# Patient Record
Sex: Male | Born: 1945 | Race: Black or African American | Hispanic: No | Marital: Single | Smoking: Former smoker
Health system: Southern US, Community
[De-identification: ages and names within clinical notes are randomized; demographics above are authoritative.]

## PROBLEM LIST (undated history)

## (undated) DIAGNOSIS — F419 Anxiety disorder, unspecified: Secondary | ICD-10-CM

## (undated) DIAGNOSIS — E785 Hyperlipidemia, unspecified: Secondary | ICD-10-CM

## (undated) DIAGNOSIS — I10 Essential (primary) hypertension: Secondary | ICD-10-CM

## (undated) DIAGNOSIS — E119 Type 2 diabetes mellitus without complications: Secondary | ICD-10-CM

## (undated) DIAGNOSIS — F32A Depression, unspecified: Secondary | ICD-10-CM

## (undated) DIAGNOSIS — M1990 Unspecified osteoarthritis, unspecified site: Secondary | ICD-10-CM

---

## 1997-07-12 ENCOUNTER — Ambulatory Visit (HOSPITAL_COMMUNITY): Admission: RE | Admit: 1997-07-12 | Discharge: 1997-07-12 | Payer: Self-pay | Admitting: Internal Medicine

## 1997-12-23 ENCOUNTER — Emergency Department (HOSPITAL_COMMUNITY): Admission: EM | Admit: 1997-12-23 | Discharge: 1997-12-23 | Payer: Self-pay | Admitting: Emergency Medicine

## 1998-07-18 ENCOUNTER — Ambulatory Visit: Admission: RE | Admit: 1998-07-18 | Discharge: 1998-07-18 | Payer: Self-pay | Admitting: Internal Medicine

## 1998-11-01 ENCOUNTER — Emergency Department (HOSPITAL_COMMUNITY): Admission: EM | Admit: 1998-11-01 | Discharge: 1998-11-01 | Payer: Self-pay

## 1999-05-22 ENCOUNTER — Encounter: Payer: Self-pay | Admitting: Urology

## 1999-05-22 ENCOUNTER — Encounter: Admission: RE | Admit: 1999-05-22 | Discharge: 1999-05-22 | Payer: Self-pay | Admitting: Urology

## 2003-03-25 ENCOUNTER — Ambulatory Visit (HOSPITAL_BASED_OUTPATIENT_CLINIC_OR_DEPARTMENT_OTHER): Admission: RE | Admit: 2003-03-25 | Discharge: 2003-03-25 | Payer: Self-pay | Admitting: Family Medicine

## 2007-02-22 ENCOUNTER — Emergency Department: Payer: Self-pay | Admitting: Unknown Physician Specialty

## 2011-07-12 ENCOUNTER — Encounter (HOSPITAL_COMMUNITY): Payer: Self-pay | Admitting: Emergency Medicine

## 2011-07-12 ENCOUNTER — Emergency Department (HOSPITAL_COMMUNITY)
Admission: EM | Admit: 2011-07-12 | Discharge: 2011-07-12 | Disposition: A | Discharge status: Home / Self Care | Payer: Non-veteran care | Attending: Emergency Medicine | Admitting: Emergency Medicine

## 2011-07-12 DIAGNOSIS — R252 Cramp and spasm: Secondary | ICD-10-CM | POA: Insufficient documentation

## 2011-07-12 DIAGNOSIS — N289 Disorder of kidney and ureter, unspecified: Secondary | ICD-10-CM | POA: Insufficient documentation

## 2011-07-12 DIAGNOSIS — E785 Hyperlipidemia, unspecified: Secondary | ICD-10-CM | POA: Insufficient documentation

## 2011-07-12 DIAGNOSIS — M79609 Pain in unspecified limb: Secondary | ICD-10-CM | POA: Insufficient documentation

## 2011-07-12 DIAGNOSIS — I10 Essential (primary) hypertension: Secondary | ICD-10-CM | POA: Insufficient documentation

## 2011-07-12 HISTORY — DX: Hyperlipidemia, unspecified: E78.5

## 2011-07-12 HISTORY — DX: Essential (primary) hypertension: I10

## 2011-07-12 LAB — DIFFERENTIAL
Lymphs Abs: 2.2 10*3/uL (ref 0.7–4.0)
Monocytes Relative: 9 % (ref 3–12)
Neutro Abs: 2.4 10*3/uL (ref 1.7–7.7)
Neutrophils Relative %: 47 % (ref 43–77)

## 2011-07-12 LAB — CBC
Hemoglobin: 15.7 g/dL (ref 13.0–17.0)
MCH: 31.4 pg (ref 26.0–34.0)
RBC: 5 MIL/uL (ref 4.22–5.81)

## 2011-07-12 LAB — BASIC METABOLIC PANEL
GFR calc Af Amer: 56 mL/min — ABNORMAL LOW (ref >90)
GFR calc non Af Amer: 49 mL/min — ABNORMAL LOW (ref >90)
Glucose, Bld: 117 mg/dL — ABNORMAL HIGH (ref 70–99)
Potassium: 3.9 mEq/L (ref 3.5–5.1)
Sodium: 135 mEq/L (ref 135–145)

## 2011-07-12 MED ORDER — IBUPROFEN 800 MG PO TABS: 800.0000 mg | ORAL_TABLET | Freq: Once | ORAL | Status: DC

## 2011-07-12 MED ORDER — NAPROXEN 375 MG PO TABS: 375.0000 mg | ORAL_TABLET | Freq: Two times a day (BID) | ORAL | Status: DC

## 2011-07-12 MED ORDER — DIAZEPAM 5 MG PO TABS
5.0000 mg | ORAL_TABLET | Freq: Once | ORAL | Status: AC
  Administered 2011-07-12: 5 mg via ORAL
  Filled 2011-07-12: qty 1

## 2011-07-12 MED ORDER — DIAZEPAM 5 MG PO TABS: 5.0000 mg | ORAL_TABLET | Freq: Once | ORAL | Status: AC

## 2011-07-12 MED ORDER — DIAZEPAM 5 MG PO TABS: ORAL_TABLET | ORAL | Status: AC

## 2011-07-12 NOTE — ED Notes (Signed)
Pt c/o of cramps in hands and feet that are intermittent x 1 week. Pt has take magnesium and seltzer water with no relief.

## 2011-07-12 NOTE — Discharge Instructions (Signed)
Take valium for muscle relaxation. Do not drive or operate machinery with valium. Follow up with your primary care provider in one week to recheck your kidney function. Stay well hydrated with 8-10 glasses of water a day. Follow up with hand orthopedic specialist in the next 1-2 weeks for recheck of ongoing symptoms but return to ER for emergent changing or worsening of symptoms.

## 2011-07-12 NOTE — ED Provider Notes (Signed)
History     CSN: [id number]  Arrival date & time 07/12/11  1224   First MD Initiated Contact with Patient 07/12/11 1340      Chief Complaint  Patient presents with  . Pain    cramping pain in hands and feet    (Consider location/radiation/quality/duration/timing/severity/associated sxs/prior treatment) HPI  Patient presents to ER complaining of a one week hx of intermittent left forearm/hand cramping and left foot cramping. Patient states pain with sporadically occur with spasming and tightening of muscles but usually if he moved hand or grips something tightly. States pain and spasm will last a minute or two and resoves if he relaxes hand and massages arm. Patient states he was started on a new blood pressure medicine a couple of weeks ago but unsure of name. Patient's PCP is through the [place]. Denies joint or extremity swelling, heat or erythema.   Patient states he has been taking OTC magnesium and seltzer water without relief of symptoms.   Past Medical History  Diagnosis Date  . Hypertension   . Hyperlipemia     History reviewed. No pertinent past surgical history.  No family history on file.  History  Substance Use Topics  . Smoking status: Former Games developer  . Smokeless tobacco: Not on file  . Alcohol Use: No      Review of Systems  All other systems reviewed and are negative.    Allergies  Review of patient's allergies indicates no known allergies.  Home Medications   Current Outpatient Rx  Name Route Sig Dispense Refill  . ACETAMINOPHEN-CODEINE #3 300-30 MG PO TABS Oral Take 1 tablet by mouth every 4 (four) hours as needed. For pain    . ASPIRIN EC 81 MG PO TBEC Oral Take 81 mg by mouth daily.    . CYCLOBENZAPRINE HCL 10 MG PO TABS Oral Take 10 mg by mouth 3 (three) times daily as needed.    . IBUPROFEN 800 MG PO TABS Oral Take 800 mg by mouth every 8 (eight) hours as needed.    . MOMETASONE FUROATE 50 MCG/ACT NA SUSP Nasal Place 2 sprays into the nose  daily.    . ADULT MULTIVITAMIN W/MINERALS CH Oral Take 1 tablet by mouth daily.    Marland Kitchen OVER THE COUNTER MEDICATION Oral Take 1 tablet by mouth 2 (two) times daily. For allergies      BP 131/89  Pulse 96  Temp 98.7 F (37.1 C)  Resp 18  SpO2 99%  Physical Exam  Nursing note and vitals reviewed. Constitutional: He is oriented to person, place, and time. He appears well-developed and well-nourished. No distress.  HENT:  Head: Normocephalic and atraumatic.  Eyes: Conjunctivae are normal.  Neck: Normal range of motion. Neck supple.  Cardiovascular: Normal rate, regular rhythm, normal heart sounds and intact distal pulses.  Exam reveals no gallop and no friction rub.   No murmur heard. Pulmonary/Chest: Effort normal and breath sounds normal. No respiratory distress. He has no wheezes. He has no rales. He exhibits no tenderness.  Musculoskeletal: Normal range of motion. He exhibits tenderness. He exhibits no edema.       Muscle spasticity of left forearm with palpation of forearm and movement of left wrist but no extremity swelling, erythema or skin changes. No heat.   Neurological: He is alert and oriented to person, place, and time.  Skin: Skin is warm and dry. No rash noted. He is not diaphoretic. No erythema.  Psychiatric: He has a normal mood and affect.  ED Course  Procedures (including critical care time)  PO ibuprofen   Labs Reviewed  BASIC METABOLIC PANEL - Abnormal; Notable for the following:    Glucose, Bld 117 (*)    BUN 29 (*)    Creatinine, Ser 1.46 (*)    GFR calc non Af Amer 49 (*)    GFR calc Af Amer 56 (*)    All other components within normal limits  CBC  DIFFERENTIAL   No results found.   1. Muscle cramps       MDM  No acute findings on labs but mild renal insufficiency that I spoke at length with patient about the need for close follow up with PCP for recheck as well as recheck of ongoing intermittent cramping. Patient voices understanding and is  agreeable to plan.   No signs of extremity infection with extremities neuro vasc intact. FROM of bilateral UE with normal reflexes and sensation.         [place], [place] 07/12/11 1529

## 2011-07-12 NOTE — ED Notes (Signed)
Pt reports painful twisting cramps in both hands and feet.

## 2011-07-14 NOTE — ED Provider Notes (Signed)
Medical screening examination/treatment/procedure(s) were performed by non-physician practitioner and as supervising physician I was immediately available for consultation/collaboration.   Marcin Holte L Jalaysia Lobb, MD 07/14/11 0736 

## 2016-01-24 ENCOUNTER — Encounter: Payer: Self-pay | Admitting: Podiatry

## 2016-01-24 ENCOUNTER — Ambulatory Visit (HOSPITAL_BASED_OUTPATIENT_CLINIC_OR_DEPARTMENT_OTHER)
Admission: RE | Admit: 2016-01-24 | Discharge: 2016-01-24 | Disposition: A | Discharge status: Home / Self Care | Payer: Non-veteran care | Source: Ambulatory Visit | Attending: Podiatry | Admitting: Podiatry

## 2016-01-24 ENCOUNTER — Ambulatory Visit (INDEPENDENT_AMBULATORY_CARE_PROVIDER_SITE_OTHER): Payer: Non-veteran care | Admitting: Podiatry

## 2016-01-24 DIAGNOSIS — R52 Pain, unspecified: Secondary | ICD-10-CM

## 2016-01-24 DIAGNOSIS — M6740 Ganglion, unspecified site: Secondary | ICD-10-CM

## 2016-01-24 NOTE — Progress Notes (Signed)
   Subjective:    Patient ID: Dale Le, male    DOB: 1945/02/28, 70 y.o.   MRN: [id number]  HPI  70 year old male presents the also concerns of painful lesion to the bottom of his left foot along the big toe. This been ongoing for the last several weeks the areas painful with pressure in shoe gear. He denies stepping on any foreign objects denies any recent injury or trauma. He states that the mass has not changed in size. He denies any skin change.he's had no recent treatment. No numbness or tingling. No other complaints at this time.   Review of Systems  All other systems reviewed and are negative.      Objective:   Physical Exam General: AAO x3, NAD  Dermatological: Skin is warm, dry and supple bilateral. Nails x 10 are well manicured; remaining integument appears unremarkable at this time. There are no open sores, no preulcerative lesions, no rash or signs of infection present.  Vascular: Dorsalis Pedis artery and Posterior Tibial artery pedal pulses are 2/4 bilateral with immedate capillary fill time. Pedal hair growth present. There is no pain with calf compression, swelling, warmth, erythema.   Neruologic: Grossly intact via light touch bilateral. Vibratory intact via tuning fork bilateral. Protective threshold with Semmes Wienstein monofilament intact to all pedal sites bilateral.   Musculoskeletal: on the plantar medial aspect of the left first MPJ is a small approximately 0.5 x 0.5 cm mobile fluid-filled soft tissue mass. There is mild tenderness to palpation. On the lesion. There is no other areas of tenderness there is no pain with MPJ range of motion. There is no other masses identified.Muscular strength 5/5 in all groups tested bilateral.  Gait: Unassisted, Nonantalgic.     Assessment & Plan:  70 year old male left foot cyst, likely ganglion on. -Treatment options discussed including all alternatives, risks, and complications -Etiology of symptoms were  discussed -X-rays were obtained and reviewed with the patient. Cannot appreciate any evidence of acute fracture or bony destruction. -I discussed with him a steroid injection into the mass and he understands this understanding risks and complications. Under sterile conditions a mixture of dexamethasone and local anesthetic was infiltrated without any complications. Post injection care was discussed. Offloading pads were dispensed. -Follow-up in 4 weeks if symptoms continue or sooner if needed. Discussed that if symptoms continue possible MRI  Ovid CurdMatthew Wagoner, DPM

## 2016-02-28 ENCOUNTER — Encounter: Payer: Self-pay | Admitting: Podiatry

## 2016-02-28 ENCOUNTER — Ambulatory Visit (INDEPENDENT_AMBULATORY_CARE_PROVIDER_SITE_OTHER): Payer: No Typology Code available for payment source | Admitting: Podiatry

## 2016-02-28 DIAGNOSIS — E119 Type 2 diabetes mellitus without complications: Secondary | ICD-10-CM | POA: Diagnosis not present

## 2016-02-28 DIAGNOSIS — M6740 Ganglion, unspecified site: Secondary | ICD-10-CM

## 2016-03-01 NOTE — Progress Notes (Signed)
Subjective: 71 year old male presents the office they for follow-up evaluation of lesion to the left foot and on the bottom of the big toe. He states that after the injection the area cut substantially smaller been started to come back. He said the area is painful the walks on it quite a bit. Denies any swelling or redness. Denies any systemic complaints such as fevers, chills, nausea, vomiting. No acute changes since last appointment, and no other complaints at this time.   Objective: AAO x3, NAD DP/PT pulses palpable bilaterally, CRT less than 3 seconds On the plantar aspect of the left hallux is a small mobile fluid filled soft tissue mass. There is mild tenderness to palpation. Measures about the same size as last time of 0.5 x 0.5 cm. There is no overlying erythema or skin irritation. Mild hammertoes are present. No open lesions or pre-ulcerative lesions.  No pain with calf compression, swelling, warmth, erythema  Assessment: Left foot soft tissue mass  Plan: -All treatment options discussed with the patient including all alternatives, risks, complications.  -Discussed MRI however he declined. Discussed a repeat steroid injection to the area and he wishes to proceed with this. Under sterile conditions a mixture of dexamethasone and local anesthetic was infiltrated into the mass without complications. Post injection care was discussed. Discussed that if the symptoms continue MRI and/or possible surgical excision. -He is requesting a prescription for diabetic shoes for the [facility]. This was provided today. -Follow up as scheduled or sooner if needed. -Patient encouraged to call the office with any questions, concerns, change in symptoms.   Ovid CurdMatthew Wagoner, DPM

## 2016-03-21 ENCOUNTER — Encounter: Payer: Self-pay | Admitting: Podiatry

## 2016-03-21 ENCOUNTER — Telehealth: Payer: Self-pay | Admitting: Podiatry

## 2016-03-21 NOTE — Telephone Encounter (Signed)
I believe this is for diabetic shoes? Can you please send this or make sure this is what she is talking about? Thanks.

## 2016-04-10 ENCOUNTER — Ambulatory Visit (INDEPENDENT_AMBULATORY_CARE_PROVIDER_SITE_OTHER): Payer: Non-veteran care | Admitting: Podiatry

## 2016-04-10 ENCOUNTER — Encounter: Payer: Self-pay | Admitting: Podiatry

## 2016-04-10 DIAGNOSIS — M6740 Ganglion, unspecified site: Secondary | ICD-10-CM

## 2016-04-10 MED ORDER — DEXAMETHASONE SODIUM PHOSPHATE 120 MG/30ML IJ SOLN
2.0000 mg | Freq: Once | INTRAMUSCULAR | Status: AC
  Administered 2016-04-10: 2 mg via INTRA_ARTICULAR

## 2016-04-10 NOTE — Progress Notes (Signed)
Subjective: 71 year old male presents the office they for follow-up evaluation of  Soft tissue mass to the left foot and on the bottom of the big toe. He states the areas to do better has gotten much smaller incisal he still notices it with walking and describes as a throbbing sensation at times. He still also awaiting diabetic shoes. Denies any increase in swelling, redness to his feet he has no new complaints today. Denies any systemic complaints such as fevers, chills, nausea, vomiting. No acute changes since last appointment, and no other complaints at this time.   Objective: AAO x3, NAD DP/PT pulses palpable bilaterally, CRT less than 3 seconds On the plantar aspect of the left hallux is a small mobile fluid filled soft tissue mass. The mass appears to be smaller in size as well as somewhat softer in nature. There is minimal tenderness to palpation to this area. There is no overlying skin changes no erythema. Mild hammertoes are present. No open lesions or pre-ulcerative lesions.  No pain with calf compression, swelling, warmth, erythema  Assessment: Left foot soft tissue mass, improvement   Plan: -All treatment options discussed with the patient including all alternatives, risks, complications.  -He would like to off on any surgical excision her MRI. He states that he like to proceed with another steroid injection to help further off the mass resolve as this is been helping. Understanding risks and complications he wished to proceed. Under sterile conditions after fixing the visit local anesthetic was infiltrated directly into the soft tissue mass without complications. Post injection care was discussed. -Follow up in 2 months or sooner if needed. -Patient encouraged to call the office with any questions, concerns, change in symptoms.   Ovid CurdMatthew Avi Archuleta, DPM

## 2016-04-24 NOTE — Telephone Encounter (Signed)
I have already sent the paperwork over and just waiting on the doctor to sign. Misty StanleyLisa

## 2016-06-12 ENCOUNTER — Encounter: Payer: Self-pay | Admitting: Podiatry

## 2016-06-12 ENCOUNTER — Ambulatory Visit (INDEPENDENT_AMBULATORY_CARE_PROVIDER_SITE_OTHER): Payer: No Typology Code available for payment source | Admitting: Podiatry

## 2016-06-12 DIAGNOSIS — M6740 Ganglion, unspecified site: Secondary | ICD-10-CM | POA: Diagnosis not present

## 2016-06-12 DIAGNOSIS — M2042 Other hammer toe(s) (acquired), left foot: Secondary | ICD-10-CM

## 2016-06-12 DIAGNOSIS — E1149 Type 2 diabetes mellitus with other diabetic neurological complication: Secondary | ICD-10-CM | POA: Diagnosis not present

## 2016-06-12 DIAGNOSIS — M2041 Other hammer toe(s) (acquired), right foot: Secondary | ICD-10-CM

## 2016-06-15 NOTE — Progress Notes (Signed)
Subjective: 71 year old male presents the office they for follow-up evaluation of soft tissue mass to the left foot and on the bottom of the big toe. She states that the area is much smaller and is doing well but he still had notices a little bit at times when goes the gym. Overall he states he is happy the progress so far. He is inquiring another injection into the cyst to further help shrink it. He also states he is starting some numbness of the tips of his right 4 toes on toes 2 through 5 when he walks. Denies any recent injury or trauma. Is also inquiring about diabetic shoes again today. Denies any systemic complaints such as fevers, chills, nausea, vomiting. No acute changes since last appointment, and no other complaints at this time.   Objective: AAO x3, NAD DP/PT pulses palpable bilaterally, CRT less than 3 seconds On the plantar aspect of the left hallux is a small mobile fluid filled soft tissue mass. Again the area appears to be improving numbness the size of a pea. There is no overlying erythema or increase in warmth. Cement reducible hammertoes are present and there is tenderness subjectively to the distal tip of the toe when he is walking. No open lesions or pre-ulcerative lesions.  No pain with calf compression, swelling, warmth, erythema  Assessment: Left foot soft tissue mass, improvement; Hammertoes  Plan: -All treatment options discussed with the patient including all alternatives, risks, complications.  -He would like to proceed with another steroid injection to the mass as this is been helping. Understanding risks and complications he wished to proceed. Under sterile conditions after fixing the visit local anesthetic was infiltrated directly into the soft tissue mass without complications. Post injection care was discussed. -Dispensed toe crests for hammertoes to see if this will help. Also discussed likely neuropathy -He was measured for diabetic shoes today to wear in a hole  limits to recheck with his insurance. He is also been contacted his insurance. -Follow up in 2 months or sooner if needed. -Patient encouraged to call the office with any questions, concerns, change in symptoms.   [name], DPM

## 2016-06-19 ENCOUNTER — Telehealth: Payer: Self-pay | Admitting: *Deleted

## 2016-06-19 NOTE — Telephone Encounter (Addendum)
Pt states Dr. Ardelle Anton wrote rx for Diabetic shoes to give to the [facility] and they lost it so he has to have another rx faxed to the [facility]. [phone]/20/2016-DrArdelle Anton ordered diabetic extra-depth shoes with custom molded inserts x3. Rx faxed to [place]. Faxed rx to [facility].

## 2016-06-20 NOTE — Telephone Encounter (Signed)
Yes, diabetic shoes extra depth with 3 custom molded inserts.

## 2016-08-07 ENCOUNTER — Encounter: Payer: Self-pay | Admitting: Podiatry

## 2016-08-07 ENCOUNTER — Ambulatory Visit (INDEPENDENT_AMBULATORY_CARE_PROVIDER_SITE_OTHER): Payer: No Typology Code available for payment source | Admitting: Podiatry

## 2016-08-07 DIAGNOSIS — L84 Corns and callosities: Secondary | ICD-10-CM

## 2016-08-07 DIAGNOSIS — E1149 Type 2 diabetes mellitus with other diabetic neurological complication: Secondary | ICD-10-CM | POA: Diagnosis not present

## 2016-08-07 NOTE — Progress Notes (Signed)
Subjective: Mr. Mikey BussingHaywood presents the office today for concerns of calluses to both of his big toes time trimmed. He states that he has lost a lot of weight and his been more active wearing shoes he notices the callus is formed his big toes. Denies any drainage or pus any redness or swelling. He just recently had his toenails trimmed. He states the mass in the left big toe as well as complaints regarding having no pain to it. No swelling. Denies any systemic complaints such as fevers, chills, nausea, vomiting. No acute changes since last appointment, and no other complaints at this time.   Objective: AAO x3, NAD DP/PT pulses palpable bilaterally, CRT less than 3 seconds On the plantar aspect the left hallux the soft tissue mass present has almost completely resolved. There is no pain in this area is no swelling or redness or any drainage or pus or any open sores this area. Hyperkeratotic lesions present bilateral medial hallux. Upon debridement there is no underlying ulceration, drainage or any signs of infection. Is no other open lesions or pre-ulcer lesions identified today.. No open lesions or pre-ulcerative lesions.  No pain with calf compression, swelling, warmth, erythema  Assessment: 71 year old male hyperkeratotic lesions bilateral hallux  Plan: -All treatment options discussed with the patient including all alternatives, risks, complications.  -Soft tissue mass is almost completely resolved. We'll hold off on another steroid injection. We will continue to monitor. -Hyperkeratotic lesions were sharply debrided 2 without complications or bleeding. -Discussed daily foot inspection. -RTC 3 months  -Patient encouraged to call the office with any questions, concerns, change in symptoms.   Ovid CurdMatthew Tasia Liz, DPM

## 2016-11-06 ENCOUNTER — Encounter: Payer: Self-pay | Admitting: Podiatry

## 2016-11-06 ENCOUNTER — Ambulatory Visit (INDEPENDENT_AMBULATORY_CARE_PROVIDER_SITE_OTHER): Payer: No Typology Code available for payment source | Admitting: Podiatry

## 2016-11-06 DIAGNOSIS — L84 Corns and callosities: Secondary | ICD-10-CM | POA: Diagnosis not present

## 2016-11-06 DIAGNOSIS — B351 Tinea unguium: Secondary | ICD-10-CM

## 2016-11-06 DIAGNOSIS — M79675 Pain in left toe(s): Secondary | ICD-10-CM | POA: Diagnosis not present

## 2016-11-06 DIAGNOSIS — M79674 Pain in right toe(s): Secondary | ICD-10-CM

## 2016-11-06 DIAGNOSIS — E1149 Type 2 diabetes mellitus with other diabetic neurological complication: Secondary | ICD-10-CM | POA: Diagnosis not present

## 2016-11-06 NOTE — Progress Notes (Addendum)
Subjective: Mr. Whitt presents the office today for concerns of calluses to both of his big toes time trimmed as well as for thick, elongated toenails the cuff irritation shoes and pain. Denies any redness or drainage from the toenail sites of the calluses. Denies any systemic complaints such as fevers, chills, nausea, vomiting. No acute changes since last appointment, and no other complaints at this time.   Objective: AAO x3, NAD DP/PT pulses palpable bilaterally, CRT less than 3 seconds On the plantar aspect the left hallux the soft tissue mass present has resolved. There is no pain in this area is no swelling or redness or any drainage or pus or any open sores this area. Hyperkeratotic lesions present bilateral medial hallux. Upon debridement there is no underlying ulceration, drainage or any signs of infection. There are no other open lesions or pre-ulcer lesions identified today. Nails appear to be dystrophic, mildly discolored yellow discoloration as well as hypertrophic and elongated. This tenderness nails 1-5 bilaterally. There is no surrounding edema, erythema, drainage or any clinical signs of infection present. No open lesions or pre-ulcerative lesions.  No pain with calf compression, swelling, warmth, erythema  Assessment: 71 year old male hyperkeratotic lesions bilateral hallux, symptomatic onychomycosis  Plan: -All treatment options discussed with the patient including all alternatives, risks, complications.  -Soft tissue mass is almost completely resolved. We'll hold off on another steroid injection. We will continue to monitor. -Hyperkeratotic lesions were sharply debrided 2 without complications or bleeding. -Nails sharply debrided x 10 without complications or bleeding -Discussed daily foot inspection. -RTC 3 months if able. Unfortunately due to insurance he will likely not be of the follow-up with me and go back to the [place] -Patient encouraged to call the office with any  questions, concerns, change in symptoms.   [name], DPM

## 2016-11-13 ENCOUNTER — Ambulatory Visit: Payer: No Typology Code available for payment source | Admitting: Podiatry

## 2016-11-20 ENCOUNTER — Ambulatory Visit: Payer: No Typology Code available for payment source | Admitting: Podiatry

## 2017-05-10 ENCOUNTER — Ambulatory Visit (INDEPENDENT_AMBULATORY_CARE_PROVIDER_SITE_OTHER): Payer: Non-veteran care | Admitting: Podiatry

## 2017-05-10 ENCOUNTER — Encounter: Payer: Self-pay | Admitting: Podiatry

## 2017-05-10 DIAGNOSIS — M79674 Pain in right toe(s): Secondary | ICD-10-CM | POA: Diagnosis not present

## 2017-05-10 DIAGNOSIS — B351 Tinea unguium: Secondary | ICD-10-CM

## 2017-05-10 DIAGNOSIS — M79675 Pain in left toe(s): Secondary | ICD-10-CM

## 2017-05-10 DIAGNOSIS — E1149 Type 2 diabetes mellitus with other diabetic neurological complication: Secondary | ICD-10-CM | POA: Diagnosis not present

## 2017-05-10 NOTE — Progress Notes (Signed)
Subjective: [name] presents the office today for concerns of thick, elongated toenails that cause irritation with shoes. Denies any redness or drainage from the toenail sites of the calluses. He is requesting diabetic shoes. He did not get them last year. Denies any systemic complaints such as fevers, chills, nausea, vomiting. No acute changes since last appointment, and no other complaints at this time.   [facility] [place] [facility]  Objective: AAO x3, NAD DP/PT pulses palpable bilaterally, CRT less than 3 seconds On the plantar aspect the left hallux the soft tissue mass with minimal reoccurrence. There is no pain to the area. No erythema or increase in warmth.  There is very minimal hyperkeratotic lesions present bilateral medial hallux. There is no underlying ulceration, drainage or any signs of infection. There are no other open lesions or pre-ulcer lesions identified today.  Nails appear to be dystrophic, mildly discolored yellow discoloration as well as mildly hypertrophic and elongated. This tenderness nails 1-5 bilaterally. There is no surrounding edema, erythema, drainage or any clinical signs of infection present. No open lesions or pre-ulcerative lesions.  No pain with calf compression, swelling, warmth, erythema  Assessment: 72 year old male with symptomatic onychomycosis  Plan: -All treatment options discussed with the patient including all alternatives, risks, complications.  -Nails sharply debrided x 10 without complications or bleeding -Discussed daily foot inspection. -He would like new diabetic shoes. Will refer back to the [facility] for this.  -RTC 3 months if able. Unfortunately due to insurance he will likely not be of the follow-up with me and go back to the [facility] -Patient encouraged to call the office with any questions, concerns, change in symptoms.   Ovid CurdMatthew Jean Alejos, DPM

## 2017-05-14 ENCOUNTER — Ambulatory Visit (INDEPENDENT_AMBULATORY_CARE_PROVIDER_SITE_OTHER): Payer: No Typology Code available for payment source | Admitting: Podiatry

## 2017-05-14 ENCOUNTER — Encounter: Payer: Self-pay | Admitting: *Deleted

## 2017-05-14 ENCOUNTER — Encounter: Payer: Self-pay | Admitting: Podiatry

## 2017-05-14 ENCOUNTER — Telehealth: Payer: Self-pay

## 2017-05-14 VITALS — BP 158/84 | HR 87 | Ht 73.0 in | Wt 230.0 lb

## 2017-05-14 DIAGNOSIS — E1149 Type 2 diabetes mellitus with other diabetic neurological complication: Secondary | ICD-10-CM

## 2017-05-14 DIAGNOSIS — M216X2 Other acquired deformities of left foot: Secondary | ICD-10-CM

## 2017-05-14 DIAGNOSIS — M216X1 Other acquired deformities of right foot: Secondary | ICD-10-CM

## 2017-05-14 DIAGNOSIS — M722 Plantar fascial fibromatosis: Secondary | ICD-10-CM

## 2017-05-14 DIAGNOSIS — M21969 Unspecified acquired deformity of unspecified lower leg: Secondary | ICD-10-CM | POA: Diagnosis not present

## 2017-05-14 NOTE — Progress Notes (Signed)
SUBJECTIVE: 72 y.o. year old male presents for diabetic foot care. Having callus under left great toe and unease feeling in arch of left foot with prolonged weight bearing.  Been diabetic x 2 year. Most recent blood glucose was 77 yesterday.  Review of Systems  Constitutional: Negative for chills, fever, malaise/fatigue and weight loss.  HENT: Negative.   Eyes: Negative.   Respiratory: Negative.   Cardiovascular: Negative.   Gastrointestinal: Negative.   Genitourinary: Negative.   Musculoskeletal:       Right knee and left Sciatic nerve pain.  Skin: Negative.      OBJECTIVE: DERMATOLOGIC EXAMINATION: Plantar medial callus left great toe.  VASCULAR EXAMINATION OF LOWER LIMBS: All pedal pulses are palpable with normal pulsation.  Capillary Filling times within 3 seconds in all digits.  No edema or erythema noted. Temperature gradient from tibial crest to dorsum of foot is within normal bilateral.  NEUROLOGIC EXAMINATION OF THE LOWER LIMBS: Achilles DTR is present and within normal. Monofilament (Semmes-Weinstein 10-gm) sensory testing positive 6 out of 6, bilateral. Vibratory sensations(128Hz  turning fork) intact at medial and lateral forefoot bilateral.  Sharp and Dull discriminatory sensations at the plantar ball of hallux is intact bilateral.   MUSCULOSKELETAL EXAMINATION: Palpable soft tissue nodule, about 1 cm in diameter at about the plantar sulcus of left great toe, symptomatic at times.  Positive for hypermobile first ray bilateral L>R. Excess STJ pronation with load bearing bilateral. Enlarged first metatarsal head L>R.  ASSESSMENT: Hypermobile first ray bilateral. Bilateral bunion L>R. 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 Diabetic under control. STJ hyperpronation bilateral.  PLAN: Reviewed findings and available treatment options. Proper shoe gear discussed. May benefit from diabetic  shoes. Will submit paper work for prior authorization on diabetic shoes.

## 2017-05-14 NOTE — Telephone Encounter (Signed)
Faxed request for auth of diabetic shoes to [facility] at [phone]. Awaiting response.

## 2017-05-14 NOTE — Patient Instructions (Signed)
Seen for diabetic foot care. Noted of weak and unstable first metatarsal bone bilateral, fibrotic mass IPJ soft tissue left great toe, Pinch callus left great toe. Callused lesion debrided. May benefit from Diabetic shoes. Will request for pre authorization on diabetic shoes. Will contact patient when we receive the authorization.

## 2017-05-16 ENCOUNTER — Encounter: Payer: Self-pay | Admitting: Podiatry

## 2017-05-22 NOTE — Telephone Encounter (Signed)
Received authorization for diabetic shoes. Date of auth is May 20 2017 Purchase card order # is [id number]. Called and spoke to rep to verify that this is an acutually an [place] for the shoes. He did confirm this See scanned auth under document list, release of information

## 2017-05-22 NOTE — Telephone Encounter (Signed)
Received auth

## 2017-05-23 ENCOUNTER — Ambulatory Visit (INDEPENDENT_AMBULATORY_CARE_PROVIDER_SITE_OTHER): Payer: No Typology Code available for payment source | Admitting: Podiatry

## 2017-05-23 ENCOUNTER — Encounter: Payer: Self-pay | Admitting: Podiatry

## 2017-05-23 ENCOUNTER — Encounter: Payer: Self-pay | Admitting: *Deleted

## 2017-05-23 DIAGNOSIS — M722 Plantar fascial fibromatosis: Secondary | ICD-10-CM

## 2017-05-23 DIAGNOSIS — M21969 Unspecified acquired deformity of unspecified lower leg: Secondary | ICD-10-CM | POA: Diagnosis not present

## 2017-05-23 DIAGNOSIS — E1149 Type 2 diabetes mellitus with other diabetic neurological complication: Secondary | ICD-10-CM | POA: Diagnosis not present

## 2017-05-23 NOTE — Patient Instructions (Signed)
Both feet measured for diabetic shoes. 

## 2017-05-23 NOTE — Progress Notes (Signed)
Subjective: 72 y.o. year old male patient presents to have diabetic shoes prepared.  Objective: MUSCULOSKELETAL EXAMINATION: Palpable soft tissue nodule, about 1 cm in diameter at about the plantar sulcus of left great toe, symptomatic at times.  Positive for hypermobile first ray bilateral L>R. Excess STJ pronation with load bearing bilateral. Enlarged first metatarsal head L>R.  ASSESSMENT: Hypermobile first ray bilateral. Bilateral bunion L>R. 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 Diabetic under control. STJ hyperpronation bilateral.  Treatment: Both feet measured for diabetic shoes and order placed.

## 2017-06-11 ENCOUNTER — Encounter: Payer: Self-pay | Admitting: *Deleted

## 2017-06-11 NOTE — Telephone Encounter (Signed)
Called pt and notified that the shoes are ready for pickup.

## 2017-07-11 ENCOUNTER — Encounter: Payer: Self-pay | Admitting: *Deleted

## 2017-07-11 ENCOUNTER — Ambulatory Visit (INDEPENDENT_AMBULATORY_CARE_PROVIDER_SITE_OTHER): Payer: No Typology Code available for payment source | Admitting: Podiatry

## 2017-07-11 ENCOUNTER — Encounter: Payer: Self-pay | Admitting: Podiatry

## 2017-07-11 DIAGNOSIS — M79675 Pain in left toe(s): Secondary | ICD-10-CM

## 2017-07-11 DIAGNOSIS — M79674 Pain in right toe(s): Secondary | ICD-10-CM | POA: Diagnosis not present

## 2017-07-11 DIAGNOSIS — L600 Ingrowing nail: Secondary | ICD-10-CM

## 2017-07-11 NOTE — Progress Notes (Signed)
Subjective: 72 y.o. year old male patient presents complaining of painful nails. There is ingrown nail on left great toe medial border. Patient requests toe nails, corns and calluses trimmed.   Objective: Dermatologic: Thick yellow deformed nails with ingrown hallucal nail medial border left. Plantar medial callus under both great toes. Vascular: Pedal pulses are all palpable. Orthopedic: Normal findings. Neurologic: All epicritic and tactile sensations grossly intact.  Assessment: Ingrown left great toe nail medial border. Painful callus both great toes.  Treatment: All mycotic nails, corns, calluses debrided.  May return for ingrown nail surgery on left great toe medial border.

## 2017-07-11 NOTE — Patient Instructions (Signed)
Seen for hypertrophic nails and ingrown left great toe medial border. All nails debrided. Need ingrown nail surgery done on left great toe medial border. Return for the procedure.

## 2017-07-12 ENCOUNTER — Telehealth: Payer: Self-pay | Admitting: *Deleted

## 2017-07-12 NOTE — Telephone Encounter (Signed)
Faxed request for auth to [facility] for ingrown toenail procedure to be done at [facility]

## 2017-07-31 ENCOUNTER — Telehealth: Payer: Self-pay | Admitting: *Deleted

## 2017-07-31 NOTE — Telephone Encounter (Signed)
Received referral for community care for for this patient. Previous referral has expired. Under procedural overview it lists procedures related to the referred condition as being authorized. Nail avulsion is listed. [facility] to clarify if if this new auth would cover ingrown toenail procedure since we never heard any thing back from them specifically for the procedure. If feel this will be covered (see#5 on procedural overview) Left a message on Misty's vm to clarify.  Misty called back and clarified that ingrown toenail procedure would be covered.  Patient scheduled.

## 2017-08-08 ENCOUNTER — Ambulatory Visit: Payer: No Typology Code available for payment source | Admitting: Podiatry

## 2017-09-03 ENCOUNTER — Ambulatory Visit: Payer: No Typology Code available for payment source | Admitting: Podiatry

## 2017-09-05 DIAGNOSIS — M79674 Pain in right toe(s): Secondary | ICD-10-CM

## 2017-12-02 ENCOUNTER — Ambulatory Visit: Payer: No Typology Code available for payment source | Admitting: Podiatry

## 2018-03-31 ENCOUNTER — Ambulatory Visit: Payer: Self-pay | Admitting: Orthopedic Surgery

## 2018-03-31 NOTE — H&P (View-Only) (Signed)
Dale Le is an 73 y.o. male.   Chief Complaint: back and leg pain HPI: Reported by patient. Location: low back Severity: pain level 8/10 Duration: The patient is years out from when symptoms began. The patient reports worsening pain over the past 5 months Timing: chronic Aggravating Factors: sitting; standing; lying down; walking; lifting; carrying Previous Surgery: none Prior Imaging: x ray; MRI Previous Injections: 02/10/2018 ESI L5-S1 01/28/2018 S1 SNRB The patient reports minimal relief from the injections  Pain will radiate down his leg on the left hand side into the outer aspect of the foot. It is worse when he stands. But worse when he sits for long periods of time. He had temporary relief from a epidural injection L5-S1 on left on 12/23.  Less help from a selective nerve root block at S1 on the left. This is performed to Dr. Ethelene Hal. I reviewed his medical records.  He takes hydrocodone for that.  His her chronic symptoms worse over the past 5 months. He has no back pain its mainly leg pain it will radiate down into his big toe as well. He has had an MRI of the lumbar spine [place] at the [facility] on Jul 11, 2017 indicated a disc herniation L5-S1 to the left and lateral recess stenosis at L4-5 underlying disc degeneration at L5-S1. He is otherwise healthy  Unable to work out  Past Medical History:  Diagnosis Date  . Hyperlipemia   . Hypertension     No past surgical history on file.  No family history on file. Social History:  reports that he has quit smoking. He has never used smokeless tobacco. He reports that he does not drink alcohol. No history on file for drug.  Allergies: No Known Allergies  Medications: Norco 10/325mg   Review of Systems  Constitutional: Negative.   HENT: Negative.   Eyes: Negative.   Respiratory: Negative.   Cardiovascular: Negative.   Gastrointestinal: Negative.   Genitourinary: Negative.   Musculoskeletal: Positive for back pain.   Skin: Negative.   Neurological: Positive for sensory change and focal weakness.  Psychiatric/Behavioral: Negative.     There were no vitals taken for this visit. Physical Exam  Constitutional: He is oriented to person, place, and time. He appears well-developed. He appears distressed.  HENT:  Head: Normocephalic.  Eyes: Pupils are equal, round, and reactive to light.  Neck: Normal range of motion.  Cardiovascular: Normal rate.  Respiratory: Effort normal.  GI: Soft.  Musculoskeletal:     Comments: Patient is a 73 year old male.  Constitutional: General Appearance: healthy-appearing and distress (mild).  Psychiatric: Mood and Affect: active and alert and normal affect.  Cardiovascular System: Edema Right: none; Dorsalis and posterior tibial pulses 2+. Edema Left: none.  Cervical Spine: Inspection: alignment normal and no muscle atrophy. Soft Tissue Palpation on the Left: tenderness of the paracervicals. Bony Palpation: no tenderness of the spinous process.  Motor Strength: Neck Strength (Intrinsics): extension 5/5. C5 on the Right: abduction deltoid 5/5. C5 on the Left: abduction deltoid 5/5. C6 on the Right: flexion biceps 5/5. C6 on the Left: flexion biceps 5/5. C7 on the Right: extension triceps 5/5 and flexion wrist 5/5. C7 on the Left: extension triceps 5/5 and flexion wrist 5/5. C8 on the Right: flexion fingers 5/5. C8 on the Left: flexion fingers 5/5. T1 on the Right: abduction fingers 5/5. T1 on the Left: abduction fingers 5/5.  Neurological System: Biceps Reflex Right: normal (2). Biceps Reflex Left: normal on the left (2). Brachioradialis Reflex Right:  normal (2). Brachioradialis Reflex Left: normal (2). Triceps Reflex Right: normal (2). Triceps Reflex Left: normal (2). Sensation on the Right: normal median nerve distribution and ulnar nerve distribution and C5 normal, C6 normal, C7 normal, C8 normal, T1 normal, and sensation of the distal extremities normal. Sensation on the  Left: normal median nerve distribution and ulnar nerve distribution and C5 normal, C6 normal, C7 normal, T1 normal, and distal extremities normal. Special Tests on the Right: Spurling's test negative. Special Tests on the Left: Spurling's test negative. Special Tests: Hoffman Sign negative. No Babinski or Clonus. Knee Reflex Right: normal (2). Knee Reflex Left: normal (2). Ankle Reflex Right: normal (2). Ankle Reflex Left: diminished (1). Babinski Reflex Right: plantar reflex absent. Babinski Reflex Left: plantar reflex absent. Special Tests on the Right: no clonus of the ankle/knee. Special Tests on the Left: no clonus of the ankle/knee and seated straight leg raising test positive.  Skin: Head and Neck: normal. Right Upper Extremity: normal. Left Upper Extremity: normal. Inspection and palpation: no rash.  Gait and Station: Appearance: ambulating with no assistive devices and antalgic gait.  Abdomen: Inspection and Palpation: non-distended and no tenderness.  Lumbar Spine: Inspection: normal alignment. Bony Palpation of the Lumbar Spine: tender at lumbosacral junction.. Bony Palpation of the Right Hip: no tenderness of the greater trochanter and tenderness of the SI joint; Pelvis stable. Bony Palpation of the Left Hip: no tenderness of the greater trochanter and tenderness of the SI joint. Soft Tissue Palpation on the Right: No flank pain with percussion. Active Range of Motion: limited flexion and extention.  Motor Strength: L1 Motor Strength on the Right: hip flexion iliopsoas 5/5. L1 Motor Strength on the Left: hip flexion iliopsoas 5/5. L2-L4 Motor Strength on the Right: knee extension quadriceps 5/5. L2-L4 Motor Strength on the Left: knee extension quadriceps 5/5. L5 Motor Strength on the Right: ankle dorsiflexion tibialis anterior 5/5 and great toe extension extensor hallucis longus 5/5. L5 Motor Strength on the Left: ankle dorsiflexion tibialis anterior 5/5 and great toe extension extensor  hallucis longus 4/5. S1 Motor Strength on the Right: plantar flexion gastrocnemius 4/5. S1 Motor Strength on the Left: plantar flexion gastrocnemius 5/5.  Neurological: He is alert and oriented to person, place, and time.  Skin: Skin is warm and dry.    3 view x-rays of the lumbar spine demonstrates severe disc degeneration with a vacuum phenomena at L5-S1. No instability in flexion-extension.  MRI demonstrates 5 neuroforaminal stenosis L5 and left a paracentral disc herniation L5-S1 to the left. Lateral recess stenosis secondary to facet and ligamentum flavum hypertrophy at L4-5.  Assessment/Plan Patient demonstrates left L5-S1 radiculopathy secondary to spinal stenosis and a disc herniation L5-S1. He is unaligned end-stage disc degeneration L5-S1 without back pain.  Confirmed due to his dermatomal chart with pain radiating to the outside and top of the foot. Temporary relief from an epidural steroid injection.  Options include living with his symptoms. With activity modification.  Other options include a lumbar decompression and given his pathology I would decompress L5-S1 and L4-5 and perform a left hemilaminectomy and a foraminotomy of L5.  He would like to proceed with that we discussed the risks and benefits.  I had an extensive discussion with the patient concerning the pathology relevant anatomy and treatment options. At this point exhausting conservative treatment and in the presence of a neurologic deficit we discussed microlumbar decompression. I discussed the risks and benefits including bleeding, infection, DVT, PE, anesthetic complications, worsening in their symptoms, improvement  in their symptoms, C SF leakage, epidural fibrosis, need for future surgeries such as revision discectomy and lumbar fusion. I also indicated that this is an operation to basically decompress the nerve root to allow recovery as opposed to fixing a herniated disc and that the incidence of recurrent chest  disc herniation can approach 15%. Also that nerve root recovery is variable and may not recover completely.  I discussed the operative course including overnight in the hospital. Immediate ambulation. Follow-up in 2 weeks for suture removal. 6 weeks until healing of the herniation followed by 6 weeks of reconditioning and strengthening of the core musculature. Also discussed the need to employ the concepts of disc pressure management and core motion following the surgery to minimize the risk of recurrent disc herniation. We will obtain preoperative clearance i if necessary and proceed accordingly.  Preoperative clearance proceed accordingly.  Plan microlumbar decompression L4-5, L5-S1  Talton Delpriore M Jaaliyah Lucatero, PA-C for Dr. Beane 03/31/2018, 8:50 AM   

## 2018-03-31 NOTE — H&P (Signed)
Dale Le is an 73 y.o. male.   Chief Complaint: back and leg pain HPI: Reported by patient. Location: low back Severity: pain level 8/10 Duration: The patient is years out from when symptoms began. The patient reports worsening pain over the past 5 months Timing: chronic Aggravating Factors: sitting; standing; lying down; walking; lifting; carrying Previous Surgery: none Prior Imaging: x ray; MRI Previous Injections: 02/10/2018 ESI L5-S1 01/28/2018 S1 SNRB The patient reports minimal relief from the injections  Pain will radiate down his leg on the left hand side into the outer aspect of the foot. It is worse when he stands. But worse when he sits for long periods of time. He had temporary relief from a epidural injection L5-S1 on left on 12/23.  Less help from a selective nerve root block at S1 on the left. This is performed to Dr. Ethelene Hal. I reviewed his medical records.  He takes hydrocodone for that.  His her chronic symptoms worse over the past 5 months. He has no back pain its mainly leg pain it will radiate down into his big toe as well. He has had an MRI of the lumbar spine [place] at the [facility] on Jul 11, 2017 indicated a disc herniation L5-S1 to the left and lateral recess stenosis at L4-5 underlying disc degeneration at L5-S1. He is otherwise healthy  Unable to work out  Past Medical History:  Diagnosis Date  . Hyperlipemia   . Hypertension     No past surgical history on file.  No family history on file. Social History:  reports that he has quit smoking. He has never used smokeless tobacco. He reports that he does not drink alcohol. No history on file for drug.  Allergies: No Known Allergies  Medications: Norco 10/325mg   Review of Systems  Constitutional: Negative.   HENT: Negative.   Eyes: Negative.   Respiratory: Negative.   Cardiovascular: Negative.   Gastrointestinal: Negative.   Genitourinary: Negative.   Musculoskeletal: Positive for back pain.   Skin: Negative.   Neurological: Positive for sensory change and focal weakness.  Psychiatric/Behavioral: Negative.     There were no vitals taken for this visit. Physical Exam  Constitutional: He is oriented to person, place, and time. He appears well-developed. He appears distressed.  HENT:  Head: Normocephalic.  Eyes: Pupils are equal, round, and reactive to light.  Neck: Normal range of motion.  Cardiovascular: Normal rate.  Respiratory: Effort normal.  GI: Soft.  Musculoskeletal:     Comments: Patient is a 73 year old male.  Constitutional: General Appearance: healthy-appearing and distress (mild).  Psychiatric: Mood and Affect: active and alert and normal affect.  Cardiovascular System: Edema Right: none; Dorsalis and posterior tibial pulses 2+. Edema Left: none.  Cervical Spine: Inspection: alignment normal and no muscle atrophy. Soft Tissue Palpation on the Left: tenderness of the paracervicals. Bony Palpation: no tenderness of the spinous process.  Motor Strength: Neck Strength (Intrinsics): extension 5/5. C5 on the Right: abduction deltoid 5/5. C5 on the Left: abduction deltoid 5/5. C6 on the Right: flexion biceps 5/5. C6 on the Left: flexion biceps 5/5. C7 on the Right: extension triceps 5/5 and flexion wrist 5/5. C7 on the Left: extension triceps 5/5 and flexion wrist 5/5. C8 on the Right: flexion fingers 5/5. C8 on the Left: flexion fingers 5/5. T1 on the Right: abduction fingers 5/5. T1 on the Left: abduction fingers 5/5.  Neurological System: Biceps Reflex Right: normal (2). Biceps Reflex Left: normal on the left (2). Brachioradialis Reflex Right:  normal (2). Brachioradialis Reflex Left: normal (2). Triceps Reflex Right: normal (2). Triceps Reflex Left: normal (2). Sensation on the Right: normal median nerve distribution and ulnar nerve distribution and C5 normal, C6 normal, C7 normal, C8 normal, T1 normal, and sensation of the distal extremities normal. Sensation on the  Left: normal median nerve distribution and ulnar nerve distribution and C5 normal, C6 normal, C7 normal, T1 normal, and distal extremities normal. Special Tests on the Right: Spurling's test negative. Special Tests on the Left: Spurling's test negative. Special Tests: Hoffman Sign negative. No Babinski or Clonus. Knee Reflex Right: normal (2). Knee Reflex Left: normal (2). Ankle Reflex Right: normal (2). Ankle Reflex Left: diminished (1). Babinski Reflex Right: plantar reflex absent. Babinski Reflex Left: plantar reflex absent. Special Tests on the Right: no clonus of the ankle/knee. Special Tests on the Left: no clonus of the ankle/knee and seated straight leg raising test positive.  Skin: Head and Neck: normal. Right Upper Extremity: normal. Left Upper Extremity: normal. Inspection and palpation: no rash.  Gait and Station: Appearance: ambulating with no assistive devices and antalgic gait.  Abdomen: Inspection and Palpation: non-distended and no tenderness.  Lumbar Spine: Inspection: normal alignment. Bony Palpation of the Lumbar Spine: tender at lumbosacral junction.. Bony Palpation of the Right Hip: no tenderness of the greater trochanter and tenderness of the SI joint; Pelvis stable. Bony Palpation of the Left Hip: no tenderness of the greater trochanter and tenderness of the SI joint. Soft Tissue Palpation on the Right: No flank pain with percussion. Active Range of Motion: limited flexion and extention.  Motor Strength: L1 Motor Strength on the Right: hip flexion iliopsoas 5/5. L1 Motor Strength on the Left: hip flexion iliopsoas 5/5. L2-L4 Motor Strength on the Right: knee extension quadriceps 5/5. L2-L4 Motor Strength on the Left: knee extension quadriceps 5/5. L5 Motor Strength on the Right: ankle dorsiflexion tibialis anterior 5/5 and great toe extension extensor hallucis longus 5/5. L5 Motor Strength on the Left: ankle dorsiflexion tibialis anterior 5/5 and great toe extension extensor  hallucis longus 4/5. S1 Motor Strength on the Right: plantar flexion gastrocnemius 4/5. S1 Motor Strength on the Left: plantar flexion gastrocnemius 5/5.  Neurological: He is alert and oriented to person, place, and time.  Skin: Skin is warm and dry.    3 view x-rays of the lumbar spine demonstrates severe disc degeneration with a vacuum phenomena at L5-S1. No instability in flexion-extension.  MRI demonstrates 5 neuroforaminal stenosis L5 and left a paracentral disc herniation L5-S1 to the left. Lateral recess stenosis secondary to facet and ligamentum flavum hypertrophy at L4-5.  Assessment/Plan Patient demonstrates left L5-S1 radiculopathy secondary to spinal stenosis and a disc herniation L5-S1. He is unaligned end-stage disc degeneration L5-S1 without back pain.  Confirmed due to his dermatomal chart with pain radiating to the outside and top of the foot. Temporary relief from an epidural steroid injection.  Options include living with his symptoms. With activity modification.  Other options include a lumbar decompression and given his pathology I would decompress L5-S1 and L4-5 and perform a left hemilaminectomy and a foraminotomy of L5.  He would like to proceed with that we discussed the risks and benefits.  I had an extensive discussion with the patient concerning the pathology relevant anatomy and treatment options. At this point exhausting conservative treatment and in the presence of a neurologic deficit we discussed microlumbar decompression. I discussed the risks and benefits including bleeding, infection, DVT, PE, anesthetic complications, worsening in their symptoms, improvement  in their symptoms, C SF leakage, epidural fibrosis, need for future surgeries such as revision discectomy and lumbar fusion. I also indicated that this is an operation to basically decompress the nerve root to allow recovery as opposed to fixing a herniated disc and that the incidence of recurrent chest  disc herniation can approach 15%. Also that nerve root recovery is variable and may not recover completely.  I discussed the operative course including overnight in the hospital. Immediate ambulation. Follow-up in 2 weeks for suture removal. 6 weeks until healing of the herniation followed by 6 weeks of reconditioning and strengthening of the core musculature. Also discussed the need to employ the concepts of disc pressure management and core motion following the surgery to minimize the risk of recurrent disc herniation. We will obtain preoperative clearance i if necessary and proceed accordingly.  Preoperative clearance proceed accordingly.  Plan microlumbar decompression L4-5, L5-S1  Dorothy SparkJaclyn M Odyssey Vasbinder, PA-C for Dr. Shelle IronBeane 03/31/2018, 8:50 AM

## 2018-04-15 NOTE — Pre-Procedure Instructions (Signed)
Dale Le  04/15/2018      [facility] - [place], [place] - [address] [phone] [address] [place]  [place] [address] Phone: [phone] Fax: [phone]    Your procedure is scheduled on Thurs., April 24, 2018 from 7:30AM-10:00AM  Report to [facility] Entrance "A" at 5:30AM  Call this number if you have problems the morning of surgery:  [phone]   Remember:  Do not eat or drink after midnight on March 4th    Take these medicines the morning of surgery with A SIP OF WATER: If needed: Acetaminophen (TYLENOL) and Cetirizine (ZYRTEC)   7 days before surgery (04/17/18), stop taking all Other Aspirin Products, Vitamins, Fish oils, and Herbal medications. Also stop all NSAIDS i.e. Advil, Ibuprofen, Motrin, Aleve, Anaprox, Naproxen, BC, Goody Powders, and all Supplements.   . Do not take MetFORMIN (GLUCOPHAGE-XR) the morning of surgery.  How to Manage Your Diabetes Before and After Surgery  Why is it important to control my blood sugar before and after surgery? . Improving blood sugar levels before and after surgery helps healing and can limit problems. . A way of improving blood sugar control is eating a healthy diet by: o  Eating less sugar and carbohydrates o  Increasing activity/exercise o  Talking with your doctor about reaching your blood sugar goals . High blood sugars (greater than 180 mg/dL) can raise your risk of infections and slow your recovery, so you will need to focus on controlling your diabetes during the weeks before surgery. . Make sure that the doctor who takes care of your diabetes knows about your planned surgery including the date and location.  How do I manage my blood sugar before surgery? . Check your blood sugar at least 4 times a day, starting 2 days before surgery, to make sure that the level is not too high or low. o Check your blood sugar the morning of your surgery when you wake up  and every 2 hours until you get to the Short Stay unit. . If your blood sugar is less than 70 mg/dL, you will need to treat for low blood sugar: o Do not take insulin. o Treat a low blood sugar (less than 70 mg/dL) with  cup of clear juice (cranberry or apple), 4 glucose tablets, OR glucose gel. Recheck blood sugar in 15 minutes after treatment (to make sure it is greater than 70 mg/dL). If your blood sugar is not greater than 70 mg/dL on recheck, call [phone] o  for further instructions. . If your CBG is greater than 220 mg/dL, inform the staff upon arrival to Short Stay.  . If you are admitted to the hospital after surgery: o Your blood sugar will be checked by the staff and you will probably be given insulin after surgery (instead of oral diabetes medicines) to make sure you have good blood sugar levels. o The goal for blood sugar control after surgery is 80-180 mg/dL.  Reviewed and Endorsed by [facility] Patient Education Committee, August 2015     Do not wear jewelry.  Do not wear lotions, powders, colognes, or deodorant.  Do not shave 48 hours prior to surgery.  Men may shave face.  Do not bring valuables to the hospital.  [facility] is not responsible for any belongings or valuables.  Contacts, dentures or bridgework may not be worn into surgery.  Leave your suitcase in the car.  After surgery it may be brought to your  room.  For patients admitted to the hospital, discharge time will be determined by your treatment team.  Patients discharged the day of surgery will not be allowed to drive home.   Special instructions:   [facility]- Preparing For Surgery  Before surgery, you can play an important role. Because skin is not sterile, your skin needs to be as free of germs as possible. You can reduce the number of germs on your skin by washing with CHG (chlorahexidine gluconate) Soap before surgery.  CHG is an antiseptic cleaner which kills germs and bonds with the skin to  continue killing germs even after washing.    Oral Hygiene is also important to reduce your risk of infection.  Remember - BRUSH YOUR TEETH THE MORNING OF SURGERY WITH YOUR REGULAR TOOTHPASTE  Please do not use if you have an allergy to CHG or antibacterial soaps. If your skin becomes reddened/irritated stop using the CHG.  Do not shave (including legs and underarms) for at least 48 hours prior to first CHG shower. It is OK to shave your face.  Please follow these instructions carefully.   1. Shower the NIGHT BEFORE SURGERY and the MORNING OF SURGERY with CHG.   2. If you chose to wash your hair, wash your hair first as usual with your normal shampoo.  3. After you shampoo, rinse your hair and body thoroughly to remove the shampoo.  4. Use CHG as you would any other liquid soap. You can apply CHG directly to the skin and wash gently with a scrungie or a clean washcloth.   5. Apply the CHG Soap to your body ONLY FROM THE NECK DOWN.  Do not use on open wounds or open sores. Avoid contact with your eyes, ears, mouth and genitals (private parts). Wash Face and genitals (private parts)  with your normal soap.  6. Wash thoroughly, paying special attention to the area where your surgery will be performed.  7. Thoroughly rinse your body with warm water from the neck down.  8. DO NOT shower/wash with your normal soap after using and rinsing off the CHG Soap.  9. Pat yourself dry with a CLEAN TOWEL.  10. Wear CLEAN PAJAMAS to bed the night before surgery, wear comfortable clothes the morning of surgery  11. Place CLEAN SHEETS on your bed the night of your first shower and DO NOT SLEEP WITH PETS.  Day of Surgery:  Do not apply any deodorants/lotions.  Please wear clean clothes to the hospital/surgery center.   Remember to brush your teeth WITH YOUR REGULAR TOOTHPASTE.  Please read over the following fact sheets that you were given. Pain Booklet, Coughing and Deep Breathing, MRSA  Information and Surgical Site Infection Prevention

## 2018-04-16 ENCOUNTER — Encounter (HOSPITAL_COMMUNITY): Payer: Self-pay

## 2018-04-16 ENCOUNTER — Encounter (HOSPITAL_COMMUNITY)
Admission: RE | Admit: 2018-04-16 | Discharge: 2018-04-16 | Disposition: A | Discharge status: Home / Self Care | Payer: No Typology Code available for payment source | Source: Ambulatory Visit | Attending: Specialist | Admitting: Specialist

## 2018-04-16 ENCOUNTER — Other Ambulatory Visit: Payer: Self-pay

## 2018-04-16 ENCOUNTER — Encounter (HOSPITAL_COMMUNITY)
Admission: RE | Admit: 2018-04-16 | Discharge: 2018-04-16 | Disposition: A | Discharge status: Home / Self Care | Payer: No Typology Code available for payment source | Source: Ambulatory Visit | Attending: Orthopedic Surgery | Admitting: Orthopedic Surgery

## 2018-04-16 DIAGNOSIS — I10 Essential (primary) hypertension: Secondary | ICD-10-CM | POA: Diagnosis not present

## 2018-04-16 DIAGNOSIS — E118 Type 2 diabetes mellitus with unspecified complications: Secondary | ICD-10-CM | POA: Insufficient documentation

## 2018-04-16 DIAGNOSIS — M48061 Spinal stenosis, lumbar region without neurogenic claudication: Secondary | ICD-10-CM | POA: Insufficient documentation

## 2018-04-16 DIAGNOSIS — Z01818 Encounter for other preprocedural examination: Secondary | ICD-10-CM | POA: Diagnosis not present

## 2018-04-16 HISTORY — DX: Anxiety disorder, unspecified: F41.9

## 2018-04-16 HISTORY — DX: Unspecified osteoarthritis, unspecified site: M19.90

## 2018-04-16 HISTORY — DX: Type 2 diabetes mellitus without complications: E11.9

## 2018-04-16 LAB — GLUCOSE, CAPILLARY: Glucose-Capillary: 151 mg/dL — ABNORMAL HIGH (ref 70–99)

## 2018-04-16 LAB — CBC
HEMATOCRIT: 41.8 % (ref 39.0–52.0)
Hemoglobin: 13.6 g/dL (ref 13.0–17.0)
MCH: 30.7 pg (ref 26.0–34.0)
MCHC: 32.5 g/dL (ref 30.0–36.0)
MCV: 94.4 fL (ref 80.0–100.0)
Platelets: 276 10*3/uL (ref 150–400)
RBC: 4.43 MIL/uL (ref 4.22–5.81)
RDW: 13.4 % (ref 11.5–15.5)
WBC: 5.7 10*3/uL (ref 4.0–10.5)
nRBC: 0 % (ref 0.0–0.2)

## 2018-04-16 LAB — BASIC METABOLIC PANEL
Anion gap: 13 (ref 5–15)
BUN: 25 mg/dL — AB (ref 8–23)
CHLORIDE: 99 mmol/L (ref 98–111)
CO2: 24 mmol/L (ref 22–32)
CREATININE: 1.65 mg/dL — AB (ref 0.61–1.24)
Calcium: 9.3 mg/dL (ref 8.9–10.3)
GFR calc Af Amer: 47 mL/min — ABNORMAL LOW (ref >60)
GFR calc non Af Amer: 41 mL/min — ABNORMAL LOW (ref >60)
GLUCOSE: 184 mg/dL — AB (ref 70–99)
Potassium: 3.9 mmol/L (ref 3.5–5.1)
SODIUM: 136 mmol/L (ref 135–145)

## 2018-04-16 LAB — HEMOGLOBIN A1C
Hgb A1c MFr Bld: 8.9 % — ABNORMAL HIGH (ref 4.8–5.6)
Mean Plasma Glucose: 208.73 mg/dL

## 2018-04-16 LAB — SURGICAL PCR SCREEN
MRSA, PCR: NEGATIVE
Staphylococcus aureus: NEGATIVE

## 2018-04-16 NOTE — Progress Notes (Addendum)
PCP - [facility]) Cardiologist - denies  Chest X-Ray - N/A Lumbar Spine X-Ray 04-16-18 EKG - 04-16-18  DM - type 2 Fasting Blood Sugar - 120-130  SA - yes, wears CPAP   Anesthesia review: yes, pulse rate slightly elevated.    Patient denies shortness of breath, fever, cough and chest pain at PAT appointment   Patient verbalized understanding of instructions that were given to them at the PAT appointment. Patient was also instructed that they will need to review over the PAT instructions again at home before surgery.

## 2018-04-17 NOTE — Anesthesia Preprocedure Evaluation (Addendum)
Anesthesia Evaluation  Patient identified by MRN, date of birth, ID band Patient awake    Reviewed: Allergy & Precautions, NPO status , Patient's Chart, lab work & pertinent test results  Airway Mallampati: II       Dental  (+) Edentulous Upper   Pulmonary former smoker,    breath sounds clear to auscultation       Cardiovascular hypertension,  Rhythm:Regular Rate:Normal     Neuro/Psych    GI/Hepatic negative GI ROS, Neg liver ROS,   Endo/Other  diabetes  Renal/GU negative Renal ROS     Musculoskeletal   Abdominal   Peds  Hematology   Anesthesia Other Findings   Reproductive/Obstetrics                           Anesthesia Physical Anesthesia Plan  ASA: III  Anesthesia Plan: General   Post-op Pain Management:    Induction: Intravenous  PONV Risk Score and Plan: Ondansetron, Dexamethasone and Midazolam  Airway Management Planned: Oral ETT  Additional Equipment:   Intra-op Plan:   Post-operative Plan: Extubation in OR  Informed Consent: I have reviewed the patients History and Physical, chart, labs and discussed the procedure including the risks, benefits and alternatives for the proposed anesthesia with the patient or authorized representative who has indicated his/her understanding and acceptance.     Dental advisory given  Plan Discussed with: Anesthesiologist and CRNA  Anesthesia Plan Comments: (Poorly controlled DMII. Dr. Shelle Iron aware. )       Anesthesia Quick Evaluation

## 2018-04-24 ENCOUNTER — Ambulatory Visit (HOSPITAL_COMMUNITY): Payer: No Typology Code available for payment source

## 2018-04-24 ENCOUNTER — Encounter (HOSPITAL_COMMUNITY): Payer: Self-pay | Admitting: *Deleted

## 2018-04-24 ENCOUNTER — Encounter (HOSPITAL_COMMUNITY)
Admission: RE | Disposition: A | Discharge status: Home / Self Care | Payer: Self-pay | Source: Home / Self Care | Attending: Specialist

## 2018-04-24 ENCOUNTER — Ambulatory Visit (HOSPITAL_COMMUNITY): Payer: No Typology Code available for payment source | Admitting: Physician Assistant

## 2018-04-24 ENCOUNTER — Other Ambulatory Visit: Payer: Self-pay

## 2018-04-24 ENCOUNTER — Ambulatory Visit (HOSPITAL_COMMUNITY)
Admission: RE | Admit: 2018-04-24 | Discharge: 2018-04-25 | Disposition: A | Discharge status: Home / Self Care | Payer: No Typology Code available for payment source | Attending: Specialist | Admitting: Specialist

## 2018-04-24 ENCOUNTER — Ambulatory Visit (HOSPITAL_COMMUNITY): Payer: No Typology Code available for payment source | Admitting: Anesthesiology

## 2018-04-24 DIAGNOSIS — E119 Type 2 diabetes mellitus without complications: Secondary | ICD-10-CM | POA: Insufficient documentation

## 2018-04-24 DIAGNOSIS — I10 Essential (primary) hypertension: Secondary | ICD-10-CM | POA: Insufficient documentation

## 2018-04-24 DIAGNOSIS — M48061 Spinal stenosis, lumbar region without neurogenic claudication: Secondary | ICD-10-CM | POA: Diagnosis present

## 2018-04-24 DIAGNOSIS — F419 Anxiety disorder, unspecified: Secondary | ICD-10-CM | POA: Insufficient documentation

## 2018-04-24 DIAGNOSIS — M5116 Intervertebral disc disorders with radiculopathy, lumbar region: Secondary | ICD-10-CM | POA: Insufficient documentation

## 2018-04-24 DIAGNOSIS — Z419 Encounter for procedure for purposes other than remedying health state, unspecified: Secondary | ICD-10-CM

## 2018-04-24 DIAGNOSIS — Z7984 Long term (current) use of oral hypoglycemic drugs: Secondary | ICD-10-CM | POA: Insufficient documentation

## 2018-04-24 DIAGNOSIS — Z79899 Other long term (current) drug therapy: Secondary | ICD-10-CM | POA: Insufficient documentation

## 2018-04-24 DIAGNOSIS — Z87891 Personal history of nicotine dependence: Secondary | ICD-10-CM | POA: Insufficient documentation

## 2018-04-24 DIAGNOSIS — F4310 Post-traumatic stress disorder, unspecified: Secondary | ICD-10-CM | POA: Insufficient documentation

## 2018-04-24 DIAGNOSIS — E785 Hyperlipidemia, unspecified: Secondary | ICD-10-CM | POA: Insufficient documentation

## 2018-04-24 DIAGNOSIS — M4807 Spinal stenosis, lumbosacral region: Secondary | ICD-10-CM | POA: Insufficient documentation

## 2018-04-24 HISTORY — PX: LUMBAR LAMINECTOMY/DECOMPRESSION MICRODISCECTOMY: SHX5026

## 2018-04-24 LAB — GLUCOSE, CAPILLARY
Glucose-Capillary: 150 mg/dL — ABNORMAL HIGH (ref 70–99)
Glucose-Capillary: 175 mg/dL — ABNORMAL HIGH (ref 70–99)

## 2018-04-24 SURGERY — LUMBAR LAMINECTOMY/DECOMPRESSION MICRODISCECTOMY 2 LEVELS
Anesthesia: General

## 2018-04-24 MED ORDER — FENTANYL CITRATE (PF) 250 MCG/5ML IJ SOLN
INTRAMUSCULAR | Status: AC
  Filled 2018-04-24: qty 5

## 2018-04-24 MED ORDER — BISACODYL 5 MG PO TBEC: 5.0000 mg | DELAYED_RELEASE_TABLET | Freq: Every day | ORAL | Status: DC | PRN

## 2018-04-24 MED ORDER — ACETAMINOPHEN 650 MG RE SUPP: 650.0000 mg | RECTAL | Status: DC | PRN

## 2018-04-24 MED ORDER — 0.9 % SODIUM CHLORIDE (POUR BTL) OPTIME
TOPICAL | Status: DC | PRN
  Administered 2018-04-24: 1000 mL

## 2018-04-24 MED ORDER — SODIUM CHLORIDE 0.9 % IV SOLN
INTRAVENOUS | Status: DC | PRN
  Administered 2018-04-24: 07:00:00

## 2018-04-24 MED ORDER — LIDOCAINE 2% (20 MG/ML) 5 ML SYRINGE
INTRAMUSCULAR | Status: AC
  Filled 2018-04-24: qty 5

## 2018-04-24 MED ORDER — OXYCODONE HCL 5 MG PO TABS
10.0000 mg | ORAL_TABLET | ORAL | Status: DC | PRN
  Administered 2018-04-24 – 2018-04-25 (×7): 10 mg via ORAL
  Filled 2018-04-24 (×7): qty 2

## 2018-04-24 MED ORDER — PHENOL 1.4 % MT LIQD: 1.0000 | OROMUCOSAL | Status: DC | PRN

## 2018-04-24 MED ORDER — CEFAZOLIN SODIUM-DEXTROSE 2-4 GM/100ML-% IV SOLN
2.0000 g | INTRAVENOUS | Status: AC
  Administered 2018-04-24: 2 g via INTRAVENOUS

## 2018-04-24 MED ORDER — BUPIVACAINE-EPINEPHRINE 0.25% -1:200000 IJ SOLN
INTRAMUSCULAR | Status: DC | PRN
  Administered 2018-04-24: 15 mL

## 2018-04-24 MED ORDER — POTASSIUM CHLORIDE IN NACL 20-0.9 MEQ/L-% IV SOLN: INTRAVENOUS | Status: AC

## 2018-04-24 MED ORDER — MAGNESIUM CITRATE PO SOLN: 1.0000 | Freq: Once | ORAL | Status: DC | PRN

## 2018-04-24 MED ORDER — ROCURONIUM BROMIDE 50 MG/5ML IV SOSY
PREFILLED_SYRINGE | INTRAVENOUS | Status: DC | PRN
  Administered 2018-04-24: 70 mg via INTRAVENOUS

## 2018-04-24 MED ORDER — OXYCODONE HCL 5 MG PO TABS: 5.0000 mg | ORAL_TABLET | ORAL | 0 refills | Status: AC | PRN

## 2018-04-24 MED ORDER — LACTATED RINGERS IV SOLN
INTRAVENOUS | Status: DC | PRN
  Administered 2018-04-24: 07:00:00 via INTRAVENOUS

## 2018-04-24 MED ORDER — RISAQUAD PO CAPS
1.0000 | ORAL_CAPSULE | Freq: Every day | ORAL | Status: DC
  Administered 2018-04-24 – 2018-04-25 (×2): 1 via ORAL
  Filled 2018-04-24 (×2): qty 1

## 2018-04-24 MED ORDER — ACETAMINOPHEN 10 MG/ML IV SOLN
INTRAVENOUS | Status: AC
  Administered 2018-04-24: 1000 mg via INTRAVENOUS
  Filled 2018-04-24: qty 100

## 2018-04-24 MED ORDER — DOCUSATE SODIUM 100 MG PO CAPS
100.0000 mg | ORAL_CAPSULE | Freq: Two times a day (BID) | ORAL | Status: DC
  Administered 2018-04-24 – 2018-04-25 (×3): 100 mg via ORAL
  Filled 2018-04-24 (×3): qty 1

## 2018-04-24 MED ORDER — PROPOFOL 10 MG/ML IV BOLUS
INTRAVENOUS | Status: AC
  Filled 2018-04-24: qty 20

## 2018-04-24 MED ORDER — ONDANSETRON HCL 4 MG/2ML IJ SOLN: 4.0000 mg | Freq: Four times a day (QID) | INTRAMUSCULAR | Status: DC | PRN

## 2018-04-24 MED ORDER — METHOCARBAMOL 500 MG PO TABS
500.0000 mg | ORAL_TABLET | Freq: Four times a day (QID) | ORAL | Status: DC | PRN
  Administered 2018-04-24 – 2018-04-25 (×3): 500 mg via ORAL
  Filled 2018-04-24 (×3): qty 1

## 2018-04-24 MED ORDER — SODIUM CHLORIDE 0.9 % IV SOLN
INTRAVENOUS | Status: DC | PRN
  Administered 2018-04-24: 20 ug/min via INTRAVENOUS

## 2018-04-24 MED ORDER — LIDOCAINE 2% (20 MG/ML) 5 ML SYRINGE
INTRAMUSCULAR | Status: DC | PRN
  Administered 2018-04-24: 40 mg via INTRAVENOUS

## 2018-04-24 MED ORDER — ACETAMINOPHEN 325 MG PO TABS
650.0000 mg | ORAL_TABLET | ORAL | Status: DC | PRN
  Administered 2018-04-24: 650 mg via ORAL
  Filled 2018-04-24: qty 2

## 2018-04-24 MED ORDER — ONDANSETRON HCL 4 MG/2ML IJ SOLN
INTRAMUSCULAR | Status: DC | PRN
  Administered 2018-04-24: 4 mg via INTRAVENOUS

## 2018-04-24 MED ORDER — ONDANSETRON HCL 4 MG/2ML IJ SOLN
INTRAMUSCULAR | Status: AC
  Filled 2018-04-24: qty 2

## 2018-04-24 MED ORDER — KCL IN DEXTROSE-NACL 20-5-0.45 MEQ/L-%-% IV SOLN: INTRAVENOUS | Status: AC

## 2018-04-24 MED ORDER — DEXAMETHASONE SODIUM PHOSPHATE 10 MG/ML IJ SOLN
INTRAMUSCULAR | Status: DC | PRN
  Administered 2018-04-24: 5 mg via INTRAVENOUS

## 2018-04-24 MED ORDER — ALUM & MAG HYDROXIDE-SIMETH 200-200-20 MG/5ML PO SUSP: 30.0000 mL | Freq: Four times a day (QID) | ORAL | Status: DC | PRN

## 2018-04-24 MED ORDER — FENTANYL CITRATE (PF) 100 MCG/2ML IJ SOLN
INTRAMUSCULAR | Status: DC | PRN
  Administered 2018-04-24 (×3): 50 ug via INTRAVENOUS

## 2018-04-24 MED ORDER — DEXAMETHASONE SODIUM PHOSPHATE 10 MG/ML IJ SOLN
INTRAMUSCULAR | Status: AC
  Filled 2018-04-24: qty 1

## 2018-04-24 MED ORDER — LACTATED RINGERS IV SOLN: INTRAVENOUS | Status: DC

## 2018-04-24 MED ORDER — ACETAMINOPHEN 10 MG/ML IV SOLN
1000.0000 mg | INTRAVENOUS | Status: AC
  Administered 2018-04-24: 1000 mg via INTRAVENOUS

## 2018-04-24 MED ORDER — THROMBIN 20000 UNITS EX SOLR
CUTANEOUS | Status: AC
  Filled 2018-04-24: qty 20000

## 2018-04-24 MED ORDER — HYDROMORPHONE HCL 1 MG/ML IJ SOLN
0.5000 mg | INTRAMUSCULAR | Status: DC | PRN
  Administered 2018-04-24: 0.5 mg via INTRAVENOUS
  Filled 2018-04-24: qty 0.5

## 2018-04-24 MED ORDER — POLYETHYLENE GLYCOL 3350 17 G PO PACK: 17.0000 g | PACK | Freq: Every day | ORAL | Status: DC | PRN

## 2018-04-24 MED ORDER — THROMBIN 20000 UNITS EX SOLR
CUTANEOUS | Status: DC | PRN
  Administered 2018-04-24: 07:00:00 via TOPICAL

## 2018-04-24 MED ORDER — METHOCARBAMOL 1000 MG/10ML IJ SOLN
500.0000 mg | Freq: Four times a day (QID) | INTRAVENOUS | Status: DC | PRN
  Filled 2018-04-24: qty 5

## 2018-04-24 MED ORDER — PROPOFOL 10 MG/ML IV BOLUS
INTRAVENOUS | Status: DC | PRN
  Administered 2018-04-24: 160 mg via INTRAVENOUS

## 2018-04-24 MED ORDER — ROCURONIUM BROMIDE 50 MG/5ML IV SOSY
PREFILLED_SYRINGE | INTRAVENOUS | Status: AC
  Filled 2018-04-24: qty 5

## 2018-04-24 MED ORDER — ONDANSETRON HCL 4 MG PO TABS: 4.0000 mg | ORAL_TABLET | Freq: Four times a day (QID) | ORAL | Status: DC | PRN

## 2018-04-24 MED ORDER — BUPIVACAINE-EPINEPHRINE (PF) 0.25% -1:200000 IJ SOLN
INTRAMUSCULAR | Status: AC
  Filled 2018-04-24: qty 30

## 2018-04-24 MED ORDER — SUGAMMADEX SODIUM 200 MG/2ML IV SOLN
INTRAVENOUS | Status: DC | PRN
  Administered 2018-04-24: 200 mg via INTRAVENOUS

## 2018-04-24 MED ORDER — POLYETHYLENE GLYCOL 3350 17 G PO PACK: 17.0000 g | PACK | Freq: Every day | ORAL | 0 refills | Status: AC

## 2018-04-24 MED ORDER — MENTHOL 3 MG MT LOZG: 1.0000 | LOZENGE | OROMUCOSAL | Status: DC | PRN

## 2018-04-24 MED ORDER — CEFAZOLIN SODIUM-DEXTROSE 2-4 GM/100ML-% IV SOLN
2.0000 g | Freq: Three times a day (TID) | INTRAVENOUS | Status: AC
  Administered 2018-04-24 – 2018-04-25 (×3): 2 g via INTRAVENOUS
  Filled 2018-04-24 (×3): qty 100

## 2018-04-24 MED ORDER — OXYCODONE HCL 5 MG PO TABS: 5.0000 mg | ORAL_TABLET | ORAL | Status: DC | PRN

## 2018-04-24 MED ORDER — ROCURONIUM BROMIDE 50 MG/5ML IV SOSY
PREFILLED_SYRINGE | INTRAVENOUS | Status: AC
  Filled 2018-04-24: qty 10

## 2018-04-24 MED ORDER — DOCUSATE SODIUM 100 MG PO CAPS: 100.0000 mg | ORAL_CAPSULE | Freq: Two times a day (BID) | ORAL | 2 refills | Status: AC

## 2018-04-24 SURGICAL SUPPLY — 61 items
BAG DECANTER FOR FLEXI CONT (MISCELLANEOUS) ×3 IMPLANT
CLOSURE WOUND 1/2 X4 (GAUZE/BANDAGES/DRESSINGS) ×1
CONT SPEC 4OZ CLIKSEAL STRL BL (MISCELLANEOUS) ×3 IMPLANT
COVER WAND RF STERILE (DRAPES) ×3 IMPLANT
DRAPE LAPAROTOMY 100X72X124 (DRAPES) ×3 IMPLANT
DRAPE MICROSCOPE LEICA (MISCELLANEOUS) ×6 IMPLANT
DRAPE SHEET LG 3/4 BI-LAMINATE (DRAPES) ×3 IMPLANT
DRAPE SURG 17X11 SM STRL (DRAPES) ×3 IMPLANT
DRAPE UTILITY XL STRL (DRAPES) ×3 IMPLANT
DRESSING AQUACEL AG SP 3.5X4 (GAUZE/BANDAGES/DRESSINGS) ×1 IMPLANT
DRSG AQUACEL AG ADV 3.5X 4 (GAUZE/BANDAGES/DRESSINGS) IMPLANT
DRSG AQUACEL AG ADV 3.5X 6 (GAUZE/BANDAGES/DRESSINGS) IMPLANT
DRSG AQUACEL AG SP 3.5X4 (GAUZE/BANDAGES/DRESSINGS) ×3
DRSG OPSITE 4X5.5 SM (GAUZE/BANDAGES/DRESSINGS) ×3 IMPLANT
DRSG TELFA 3X8 NADH (GAUZE/BANDAGES/DRESSINGS) IMPLANT
DURAPREP 26ML APPLICATOR (WOUND CARE) ×3 IMPLANT
DURASEAL SPINE SEALANT 3ML (MISCELLANEOUS) IMPLANT
ELECT BLADE 4.0 EZ CLEAN MEGAD (MISCELLANEOUS) ×3
ELECT REM PT RETURN 9FT ADLT (ELECTROSURGICAL) ×3
ELECTRODE BLDE 4.0 EZ CLN MEGD (MISCELLANEOUS) ×1 IMPLANT
ELECTRODE REM PT RTRN 9FT ADLT (ELECTROSURGICAL) ×1 IMPLANT
EVACUATOR 1/8 PVC DRAIN (DRAIN) ×3 IMPLANT
GAUZE SPONGE 4X4 12PLY STRL (GAUZE/BANDAGES/DRESSINGS) ×3 IMPLANT
GLOVE BIO SURGEON STRL SZ 6 (GLOVE) ×9 IMPLANT
GLOVE BIOGEL PI IND STRL 6.5 (GLOVE) ×1 IMPLANT
GLOVE BIOGEL PI IND STRL 7.0 (GLOVE) ×2 IMPLANT
GLOVE BIOGEL PI INDICATOR 6.5 (GLOVE) ×2
GLOVE BIOGEL PI INDICATOR 7.0 (GLOVE) ×4
GLOVE SURG SS PI 7.5 STRL IVOR (GLOVE) ×6 IMPLANT
GLOVE SURG SS PI 8.0 STRL IVOR (GLOVE) ×6 IMPLANT
GOWN STRL REUS W/ TWL LRG LVL3 (GOWN DISPOSABLE) ×1 IMPLANT
GOWN STRL REUS W/ TWL XL LVL3 (GOWN DISPOSABLE) ×1 IMPLANT
GOWN STRL REUS W/TWL LRG LVL3 (GOWN DISPOSABLE) ×2
GOWN STRL REUS W/TWL XL LVL3 (GOWN DISPOSABLE) ×2
IV CATH 14GX2 1/4 (CATHETERS) ×3 IMPLANT
KIT BASIN OR (CUSTOM PROCEDURE TRAY) ×3 IMPLANT
KIT POSITION SURG JACKSON T1 (MISCELLANEOUS) ×3 IMPLANT
NEEDLE 22X1 1/2 (OR ONLY) (NEEDLE) ×3 IMPLANT
NEEDLE SPNL 18GX3.5 QUINCKE PK (NEEDLE) ×6 IMPLANT
PACK LAMINECTOMY NEURO (CUSTOM PROCEDURE TRAY) ×3 IMPLANT
PATTIES SURGICAL .75X.75 (GAUZE/BANDAGES/DRESSINGS) ×3 IMPLANT
RUBBERBAND STERILE (MISCELLANEOUS) ×12 IMPLANT
SPONGE LAP 4X18 RFD (DISPOSABLE) IMPLANT
SPONGE SURGIFOAM ABS GEL 100 (HEMOSTASIS) ×3 IMPLANT
STAPLER VISISTAT (STAPLE) ×3 IMPLANT
STRIP CLOSURE SKIN 1/2X4 (GAUZE/BANDAGES/DRESSINGS) ×2 IMPLANT
SUT NURALON 4 0 TR CR/8 (SUTURE) IMPLANT
SUT PROLENE 3 0 PS 2 (SUTURE) IMPLANT
SUT VIC AB 1 CT1 27 (SUTURE)
SUT VIC AB 1 CT1 27XBRD ANTBC (SUTURE) IMPLANT
SUT VIC AB 1 CTB1 27 (SUTURE) ×9 IMPLANT
SUT VIC AB 1-0 CT2 27 (SUTURE) IMPLANT
SUT VIC AB 2-0 CT1 27 (SUTURE) ×2
SUT VIC AB 2-0 CT1 TAPERPNT 27 (SUTURE) ×1 IMPLANT
SUT VIC AB 2-0 CT2 27 (SUTURE) IMPLANT
SYR 3ML LL SCALE MARK (SYRINGE) ×3 IMPLANT
TOWEL GREEN STERILE (TOWEL DISPOSABLE) ×3 IMPLANT
TOWEL GREEN STERILE FF (TOWEL DISPOSABLE) ×3 IMPLANT
TRAY FOLEY MTR SLVR 16FR STAT (SET/KITS/TRAYS/PACK) ×3 IMPLANT
WIPE OXIVIR TB 11X12 REFILL PK (MISCELLANEOUS) ×3 IMPLANT
YANKAUER SUCT BULB TIP NO VENT (SUCTIONS) ×3 IMPLANT

## 2018-04-24 NOTE — Interval H&P Note (Signed)
History and Physical Interval Note:  04/24/2018 7:20 AM  Dale Le  has presented today for surgery, with the diagnosis of Spinal stenosis L4-S1  The various methods of treatment have been discussed with the patient and family. After consideration of risks, benefits and other options for treatment, the patient has consented to  Procedure(s) with comments: Microlumbar decompression L4-5 and L5-S1 (N/A) - 2.5 hrs as a surgical intervention .  The patient's history has been reviewed, patient examined, no change in status, stable for surgery.  I have reviewed the patient's chart and labs.  Questions were answered to the patient's satisfaction.     Javier Docker

## 2018-04-24 NOTE — Transfer of Care (Signed)
Immediate Anesthesia Transfer of Care Note  Patient: Dale Le  Procedure(s) Performed: Microlumbar decompression Lumbar four-five and Lumbar five-sacral one (N/A )  Patient Location: PACU  Anesthesia Type:General  Level of Consciousness: awake, alert  and oriented  Airway & Oxygen Therapy: Patient Spontanous Breathing and Patient connected to nasal cannula oxygen  Post-op Assessment: Report given to RN, Post -op Vital signs reviewed and stable and Patient moving all extremities X 4  Post vital signs: Reviewed and stable  Last Vitals:  Vitals Value Taken Time  BP 126/81 04/24/2018 10:24 AM  Temp    Pulse 95 04/24/2018 10:26 AM  Resp 22 04/24/2018 10:26 AM  SpO2 100 % 04/24/2018 10:26 AM  Vitals shown include unvalidated device data.  Last Pain:  Vitals:   04/24/18 0555  TempSrc:   PainSc: 10-Worst pain ever      Patients Stated Pain Goal: 2 (04/24/18 0555)  Complications: No apparent anesthesia complications

## 2018-04-24 NOTE — Discharge Instructions (Signed)

## 2018-04-24 NOTE — Discharge Summary (Signed)
Physician Discharge Summary   Patient ID: Dale Le MRN: [id number] DOB/AGE: 08-27-1945 73 y.o.  Admit date: 04/24/2018 Discharge date:  04/25/2018  Primary Diagnosis:   Spinal stenosis Lumbar four-Sacral one  Admission Diagnoses:  Past Medical History:  Diagnosis Date  . Anxiety    PTSD  . Arthritis   . Diabetes mellitus without complication (HCC)    type 2  . Hyperlipemia   . Hypertension    Discharge Diagnoses:   Principal Problem:   Spinal stenosis of lumbar region Active Problems:   Spinal stenosis at L4-L5 level  Procedure:  Procedure(s) (LRB): Microlumbar decompression Lumbar four-five and Lumbar five-sacral one (N/A)   Consults: None  HPI:  see H&P    Laboratory Data: Hospital Outpatient Visit on 04/16/2018  Component Date Value Ref Range Status  . Glucose-Capillary 04/16/2018 151* 70 - 99 mg/dL Final  . Sodium 49/20/1007 136  135 - 145 mmol/L Final  . Potassium 04/16/2018 3.9  3.5 - 5.1 mmol/L Final  . Chloride 04/16/2018 99  98 - 111 mmol/L Final  . CO2 04/16/2018 24  22 - 32 mmol/L Final  . Glucose, Bld 04/16/2018 184* 70 - 99 mg/dL Final  . BUN 02/07/7587 25* 8 - 23 mg/dL Final  . Creatinine, Ser 04/16/2018 1.65* 0.61 - 1.24 mg/dL Final  . Calcium 32/54/9826 9.3  8.9 - 10.3 mg/dL Final  . GFR calc non Af Amer 04/16/2018 41* >60 mL/min Final  . GFR calc Af Amer 04/16/2018 47* >60 mL/min Final  . Anion gap 04/16/2018 13  5 - 15 Final   Performed at [facility], [address], [place], [place] [address]  . WBC 04/16/2018 5.7  4.0 - 10.5 K/uL Final  . RBC 04/16/2018 4.43  4.22 - 5.81 MIL/uL Final  . Hemoglobin 04/16/2018 13.6  13.0 - 17.0 g/dL Final  . HCT 09/40/7680 41.8  39.0 - 52.0 % Final  . MCV 04/16/2018 94.4  80.0 - 100.0 fL Final  . MCH 04/16/2018 30.7  26.0 - 34.0 pg Final  . MCHC 04/16/2018 32.5  30.0 - 36.0 g/dL Final  . RDW 88/12/313 13.4  11.5 - 15.5 % Final  . Platelets 04/16/2018 276  150 - 400 K/uL Final  . nRBC  04/16/2018 0.0  0.0 - 0.2 % Final   Performed at [facility], [address], [place], [place] [address]  . MRSA, PCR 04/16/2018 NEGATIVE  NEGATIVE Final  . Staphylococcus aureus 04/16/2018 NEGATIVE  NEGATIVE Final   Comment: (NOTE) The Xpert SA Assay (FDA approved for NASAL specimens in patients 73 years of age and older), is one component of a comprehensive surveillance program. It is not intended to diagnose infection nor to guide or monitor treatment. Performed at [facility], [address], [place], [place] [address]   . Hgb A1c MFr Bld 04/16/2018 8.9* 4.8 - 5.6 % Final   Comment: (NOTE) Pre diabetes:          5.7%-6.4% Diabetes:              >6.4% Glycemic control for   <7.0% adults with diabetes   . Mean Plasma Glucose 04/16/2018 208.73  mg/dL Final   Performed at [facility], [address], [place], [place] [address]   No results for input(s): HGB in the last 72 hours. No results for input(s): WBC, RBC, HCT, PLT in the last 72 hours. No results for input(s): NA, K, CL, CO2, BUN, CREATININE, GLUCOSE,  CALCIUM in the last 72 hours. No results for input(s): LABPT, INR in the last 72 hours.  X-Rays:Dg Lumbar Spine 2-3 Views  Result Date: 04/24/2018 CLINICAL DATA:  Intraoperative localization EXAM: LUMBAR SPINE - 2-3 VIEW COMPARISON:  04/16/2018 FINDINGS: Three lateral views of the lumbar spine were obtained for intraoperative localization. The initial film demonstrates needles posterior to the L4-5 interspace as well as posterior to the S2-3 level. Second film demonstrates surgical retractors and instruments posterior to L4-5 and L5-S1. Third film shows instruments posterior to the L3-4 and L5-S1 levels. IMPRESSION: Intraoperative localization Electronically Signed   By: Alcide Clever M.D.   On: 04/24/2018 13:06   Dg Lumbar Spine 2-3 Views  Result Date: 04/16/2018 CLINICAL DATA:  Preop evaluation for upcoming lumbar surgery EXAM: LUMBAR SPINE - 2-3 VIEW  COMPARISON:  None. FINDINGS: Five lumbar type vertebral bodies are well visualized. Numbering nomenclature follow standard numbering. Disc space narrowing is noted at L4-5 and to a greater degree at L5-S1 with vacuum disc phenomena. Mild osteophytic changes are seen. No anterolisthesis is noted. No soft tissue abnormality is noted. IMPRESSION: Degenerative change of lumbar spine. Electronically Signed   By: Alcide Clever M.D.   On: 04/16/2018 16:31    EKG: Orders placed or performed during the hospital encounter of 04/16/18  . EKG 12 lead  . EKG 12 lead     Hospital Course: Patient was admitted to [facility] and taken to the OR and underwent the above state procedure without complications.  Patient tolerated the procedure well and was later transferred to the recovery room and then to the orthopaedic floor for postoperative care.  They were given PO and IV analgesics for pain control following their surgery.  They were given 24 hours of postoperative antibiotics.   PT was consulted postop to assist with mobility and transfers.  The patient was allowed to be WBAT with therapy and was taught back precautions. Discharge planning was consulted to help with postop disposition and equipment needs.  Patient had a fair night on the evening of surgery and started to get up OOB with therapy on day one. Patient was seen in rounds and was ready to go home on day one.  They were given discharge instructions and dressing directions.  They were instructed on when to follow up in the office with Dr. Shelle Iron.   Diet: Diabetic diet Activity:WBAT, Lspine precautions Follow-up:in 14 days Disposition - Home Discharged Condition: good    Allergies as of 04/24/2018   No Known Allergies     Medication List    TAKE these medications   acetaminophen 325 MG tablet Commonly known as:  TYLENOL Take 650 mg by mouth every 6 (six) hours as needed for moderate pain or headache.   cetirizine 10 MG tablet Commonly  known as:  ZYRTEC Take 10 mg by mouth daily as needed for allergies.   diphenhydrAMINE 25 MG tablet Commonly known as:  BENADRYL Take 25 mg by mouth at bedtime as needed for allergies or sleep.   docusate sodium 100 MG capsule Commonly known as:  Colace Take 1 capsule (100 mg total) by mouth 2 (two) times daily.   metFORMIN 500 MG 24 hr tablet Commonly known as:  GLUCOPHAGE-XR Take 500 mg by mouth daily.   multivitamin with minerals Tabs tablet Take 1 tablet by mouth daily.   oxyCODONE 5 MG immediate release tablet Commonly known as:  Roxicodone Take 1-2 tablets (5-10 mg total) by mouth every 4 (four) hours as  needed.   polyethylene glycol packet Commonly known as:  MiraLax Take 17 g by mouth daily.      Follow-up Information    Jene EveryBeane, Jeffrey, MD Follow up in 2 week(s).   Specialty:  Orthopedic Surgery Contact information: [address] [place] [id number] [phone]           Signed: Andrez GrimeJaclyn Bissell, PA-C Orthopaedic Surgery 04/24/2018, 4:26 PM

## 2018-04-24 NOTE — Anesthesia Procedure Notes (Signed)
Procedure Name: Intubation Date/Time: 04/24/2018 7:32 AM Performed by: Kyung Rudd, CRNA Pre-anesthesia Checklist: Patient identified, Emergency Drugs available, Suction available and Patient being monitored Patient Re-evaluated:Patient Re-evaluated prior to induction Oxygen Delivery Method: Circle system utilized Preoxygenation: Pre-oxygenation with 100% oxygen Induction Type: IV induction Ventilation: Mask ventilation without difficulty Laryngoscope Size: Mac and 3 Grade View: Grade I Tube type: Oral Tube size: 7.0 mm Number of attempts: 1 Airway Equipment and Method: Stylet Placement Confirmation: ETT inserted through vocal cords under direct vision,  positive ETCO2 and breath sounds checked- equal and bilateral Secured at: 22 cm Tube secured with: Tape Dental Injury: Teeth and Oropharynx as per pre-operative assessment

## 2018-04-24 NOTE — Evaluation (Signed)
Physical Therapy Evaluation Patient Details Name: Dale Le MRN: [id number] DOB: Nov 20, 1945 Today's Date: 04/24/2018   History of Present Illness  Pt is a 73 y.o. M with no significant PMH on file who presents s/p microlumbar decompression L4-5, L5-S1  Clinical Impression  Patient is s/p above surgery resulting in the deficits listed below (see PT Problem List). On PT evaluation, pt currently ambulating 200 feet using cane and min guard assist. Pt reports increased left hip/glute pain towards end of walk which subsided after x 5 minutes supine. Pt slightly impulsive and needs reinforcement for back precautions. Pt strongly discouraged from going back to the gym to get on the treadmill and PT recommended overground walking for exercise.  Patient will benefit from skilled PT to increase their independence and safety with mobility (while adhering to their precautions) to allow discharge to the venue listed below.     Follow Up Recommendations No PT follow up;Supervision - Intermittent    Equipment Recommendations  3in1 (PT)    Recommendations for Other Services       Precautions / Restrictions Precautions Precautions: Fall;Back Precaution Booklet Issued: Yes (comment) Precaution Comments: needs reinforcement Restrictions Weight Bearing Restrictions: No      Mobility  Bed Mobility Overal bed mobility: Modified Independent             General bed mobility comments: increased effort. cues for log roll technique  Transfers Overall transfer level: Needs assistance Equipment used: None Transfers: Sit to/from Stand Sit to Stand: Min guard         General transfer comment: min guard for safety. pt incorporating wide BOS  Ambulation/Gait Ambulation/Gait assistance: Min guard Gait Distance (Feet): 200 Feet Assistive device: Straight cane Gait Pattern/deviations: Step-through pattern;Decreased dorsiflexion - left;Decreased dorsiflexion - right;Antalgic Gait velocity:  decreased   General Gait Details: Pt with mildly antalgic gait pattern which worsened with increased distance  Stairs            Wheelchair Mobility    Modified Rankin (Stroke Patients Only)       Balance Overall balance assessment: Mild deficits observed, not formally tested                                           Pertinent Vitals/Pain Pain Assessment: Faces Faces Pain Scale: Hurts worst Pain Location: left hip/glute at end of ambulation Pain Descriptors / Indicators: Sharp;Shooting Pain Intervention(s): Monitored during session;Patient requesting pain meds-RN notified    Home Living Family/patient expects to be discharged to:: Private residence Living Arrangements: Alone Available Help at Discharge: Family;Available PRN/intermittently(daughter) Type of Home: House Home Access: Stairs to enter   Entrance Stairs-Number of Steps: 1 Home Layout: Two level Home Equipment: Cane - single point      Prior Function Level of Independence: Independent with assistive device(s)         Comments: has been using cane recently due to pain. previously went to the gym 5x/week     Hand Dominance        Extremity/Trunk Assessment   Upper Extremity Assessment Upper Extremity Assessment: Overall WFL for tasks assessed    Lower Extremity Assessment Lower Extremity Assessment: RLE deficits/detail;LLE deficits/detail RLE Deficits / Details: Strength 5/5 LLE Deficits / Details: Strength 5/5    Cervical / Trunk Assessment Cervical / Trunk Assessment: Other exceptions Cervical / Trunk Exceptions: s/p microlumbar decompression  Communication   Communication: No  difficulties  Cognition Arousal/Alertness: Awake/alert Behavior During Therapy: WFL for tasks assessed/performed Overall Cognitive Status: Within Functional Limits for tasks assessed                                        General Comments      Exercises Other  Exercises Other Exercises: Gentle knee to chest and piriformis stretching supine   Assessment/Plan    PT Assessment Patient needs continued PT services  PT Problem List Decreased activity tolerance;Decreased balance;Decreased mobility;Decreased knowledge of precautions;Pain       PT Treatment Interventions DME instruction;Gait training;Functional mobility training;Stair training;Therapeutic activities;Therapeutic exercise;Balance training;Neuromuscular re-education;Patient/family education    PT Goals (Current goals can be found in the Care Plan section)  Acute Rehab PT Goals Patient Stated Goal: "get back to the gym." PT Goal Formulation: With patient Time For Goal Achievement: 04/29/18 Potential to Achieve Goals: Good    Frequency Min 5X/week   Barriers to discharge        Co-evaluation               AM-PAC PT "6 Clicks" Mobility  Outcome Measure Help needed turning from your back to your side while in a flat bed without using bedrails?: None Help needed moving from lying on your back to sitting on the side of a flat bed without using bedrails?: None Help needed moving to and from a bed to a chair (including a wheelchair)?: A Little Help needed standing up from a chair using your arms (e.g., wheelchair or bedside chair)?: A Little Help needed to walk in hospital room?: A Little Help needed climbing 3-5 steps with a railing? : A Little 6 Click Score: 20    End of Session   Activity Tolerance: Patient limited by pain((towards end of session)) Patient left: in bed;with call bell/phone within reach Nurse Communication: Mobility status PT Visit Diagnosis: Unsteadiness on feet (R26.81);Other abnormalities of gait and mobility (R26.89);Pain;Difficulty in walking, not elsewhere classified (R26.2) Pain - part of body: (back)    Time: 6301-6010 PT Time Calculation (min) (ACUTE ONLY): 15 min   Charges:   PT Evaluation $PT Eval Low Complexity: 1 Low           Laurina Bustle, PT, DPT Acute Rehabilitation Services Pager [phone] Office [phone]   Vanetta Mulders 04/24/2018, 5:16 PM

## 2018-04-24 NOTE — Anesthesia Postprocedure Evaluation (Signed)
Anesthesia Post Note  Patient: Dale Le  Procedure(s) Performed: Microlumbar decompression Lumbar four-five and Lumbar five-sacral one (N/A )     Patient location during evaluation: PACU Anesthesia Type: General Level of consciousness: awake Pain management: pain level controlled Vital Signs Assessment: post-procedure vital signs reviewed and stable Cardiovascular status: stable Postop Assessment: no apparent nausea or vomiting Anesthetic complications: no    Last Vitals:  Vitals:   04/24/18 1125 04/24/18 1138  BP: 125/73 137/80  Pulse: 81 82  Resp: 14 16  Temp:  36.6 C  SpO2: 99% 100%    Last Pain:  Vitals:   04/24/18 1330  TempSrc:   PainSc: 7                  Ludell Zacarias

## 2018-04-24 NOTE — Interval H&P Note (Signed)
History and Physical Interval Note:  04/24/2018 7:20 AM  Dale Le  has presented today for surgery, with the diagnosis of Spinal stenosis L4-S1  The various methods of treatment have been discussed with the patient and family. After consideration of risks, benefits and other options for treatment, the patient has consented to  Procedure(s) with comments: Microlumbar decompression L4-5 and L5-S1 (N/A) - 2.5 hrs as a surgical intervention .  The patient's history has been reviewed, patient examined, no change in status, stable for surgery.  I have reviewed the patient's chart and labs.  Questions were answered to the patient's satisfaction.     Brianne Maina C Albin Duckett   

## 2018-04-25 ENCOUNTER — Encounter (HOSPITAL_COMMUNITY): Payer: Self-pay | Admitting: Specialist

## 2018-04-25 DIAGNOSIS — M5116 Intervertebral disc disorders with radiculopathy, lumbar region: Secondary | ICD-10-CM | POA: Diagnosis not present

## 2018-04-25 LAB — BASIC METABOLIC PANEL
Anion gap: 11 (ref 5–15)
BUN: 18 mg/dL (ref 8–23)
CO2: 22 mmol/L (ref 22–32)
Calcium: 8.6 mg/dL — ABNORMAL LOW (ref 8.9–10.3)
Chloride: 101 mmol/L (ref 98–111)
Creatinine, Ser: 1.55 mg/dL — ABNORMAL HIGH (ref 0.61–1.24)
GFR calc Af Amer: 51 mL/min — ABNORMAL LOW (ref >60)
GFR, EST NON AFRICAN AMERICAN: 44 mL/min — AB (ref >60)
Glucose, Bld: 150 mg/dL — ABNORMAL HIGH (ref 70–99)
Potassium: 4.2 mmol/L (ref 3.5–5.1)
Sodium: 134 mmol/L — ABNORMAL LOW (ref 135–145)

## 2018-04-25 LAB — CBC
HCT: 38.1 % — ABNORMAL LOW (ref 39.0–52.0)
Hemoglobin: 12 g/dL — ABNORMAL LOW (ref 13.0–17.0)
MCH: 30.2 pg (ref 26.0–34.0)
MCHC: 31.5 g/dL (ref 30.0–36.0)
MCV: 95.7 fL (ref 80.0–100.0)
PLATELETS: 240 10*3/uL (ref 150–400)
RBC: 3.98 MIL/uL — AB (ref 4.22–5.81)
RDW: 13.6 % (ref 11.5–15.5)
WBC: 9.1 10*3/uL (ref 4.0–10.5)
nRBC: 0 % (ref 0.0–0.2)

## 2018-04-25 NOTE — Evaluation (Signed)
Occupational Therapy Evaluation Patient Details Name: Dale Le MRN: [id number] DOB: 10-Jan-1946 Today's Date: 04/25/2018    History of Present Illness Pt is a 73 y.o. M with no significant PMH on file who presents s/p microlumbar decompression L4-5, L5-S1   Clinical Impression   Pt PTA: living alone, independent with ADL, using SPC for mobility recently. Pt currently. Limited by L gluteal pain after ambulation. Pt using SPC for mobility with modified independence; ADL lower body with figure four technique. Pt required cues for stating back precautions, but able to abide by them for simple dressing task. Pt wanting long handled sponge from [place] in [place] and spoke with MD on phone on how to obtain it. Pt advised to set alarms for pain meds. Pt has family checking in. Pt does not require continued OT skilled services. OT signing off.    Follow Up Recommendations  No OT follow up;Supervision - Intermittent    Equipment Recommendations  Other (comment)(long handled sponge)    Recommendations for Other Services       Precautions / Restrictions Precautions Precautions: Fall;Back Precaution Booklet Issued: Yes (comment) Precaution Comments: confident in abilities Restrictions Weight Bearing Restrictions: No      Mobility Bed Mobility Overal bed mobility: Modified Independent             General bed mobility comments: increased time  Transfers Overall transfer level: Needs assistance Equipment used: None Transfers: Sit to/from Stand Sit to Stand: Modified independent (Device/Increase time)              Balance Overall balance assessment: Modified Independent                                         ADL either performed or assessed with clinical judgement   ADL Overall ADL's : At baseline                                       General ADL Comments: Pt performing lower body dress with figure four technique abiding by back  precautions. Pt interested in LH sponge for LB ADL. MD called pt and pt requested to have one sent with his prescription for pickup [facility].     Vision Baseline Vision/History: No visual deficits Vision Assessment?: No apparent visual deficits     Perception     Praxis      Pertinent Vitals/Pain Pain Assessment: Faces Faces Pain Scale: Hurts little more Pain Location: left hip/glute at end of ambulation Pain Descriptors / Indicators: Tender Pain Intervention(s): Repositioned     Hand Dominance     Extremity/Trunk Assessment Upper Extremity Assessment Upper Extremity Assessment: RUE deficits/detail;LUE deficits/detail RUE Deficits / Details:  5/5 MM grade LUE Deficits / Details: 5/5 MM grade LUE Sensation: WNL LUE Coordination: decreased fine motor   Lower Extremity Assessment Lower Extremity Assessment: Overall WFL for tasks assessed;Defer to PT evaluation   Cervical / Trunk Assessment Cervical / Trunk Assessment: Other exceptions Cervical / Trunk Exceptions: s/p microlumbar decompression   Communication Communication Communication: No difficulties   Cognition Arousal/Alertness: Awake/alert Behavior During Therapy: WFL for tasks assessed/performed Overall Cognitive Status: Within Functional Limits for tasks assessed  General Comments  reports family can check in on him, but plans to be independent and eager to return to gym,    Exercises     Shoulder Instructions      Home Living Family/patient expects to be discharged to:: Private residence Living Arrangements: Alone Available Help at Discharge: Family;Available PRN/intermittently(daughter) Type of Home: House Home Access: Stairs to enter Entrance Stairs-Number of Steps: 1   Home Layout: Two level Alternate Level Stairs-Number of Steps: flight   Bathroom Shower/Tub: Producer, television/film/video: Standard     Home Equipment: Cane - single point           Prior Functioning/Environment Level of Independence: Independent with assistive device(s)        Comments: has been using cane recently due to pain. previously went to the gym 5x/week        OT Problem List:        OT Treatment/Interventions:      OT Goals(Current goals can be found in the care plan section) Acute Rehab OT Goals Patient Stated Goal: "get back to the gym."  OT Frequency:     Barriers to D/C:            Co-evaluation              AM-PAC OT "6 Clicks" Daily Activity     Outcome Measure Help from another person eating meals?: None Help from another person taking care of personal grooming?: None Help from another person toileting, which includes using toliet, bedpan, or urinal?: None Help from another person bathing (including washing, rinsing, drying)?: None Help from another person to put on and taking off regular upper body clothing?: None Help from another person to put on and taking off regular lower body clothing?: None 6 Click Score: 24   End of Session Equipment Utilized During Treatment: Other (comment)(SPC) Nurse Communication: Mobility status  Activity Tolerance: Patient tolerated treatment well Patient left: in bed;with call bell/phone within reach  OT Visit Diagnosis: Unsteadiness on feet (R26.81);Muscle weakness (generalized) (M62.81)                Time: 1610-9604 OT Time Calculation (min): 20 min Charges:  OT General Charges $OT Visit: 1 Visit OT Evaluation $OT Eval Moderate Complexity: 1 Mod  Cristi Loron) Glendell Docker OTR/L Acute Rehabilitation Services Pager: [phone] Office: [phone]   Sandrea Hughs 04/25/2018, 8:57 AM

## 2018-04-25 NOTE — Progress Notes (Signed)
Subjective: 1 Day Post-Op Procedure(s) (LRB): Microlumbar decompression Lumbar four-five and Lumbar five-sacral one (N/A) Patient reports pain as moderate.  Incisional back pain. Reports some left buttock/thigh pain when up and walking last night. Voiding without difficulty. No other c/o. Feels ready to go home.  Objective: Vital signs in last 24 hours: Temp:  [97.3 F (36.3 C)-98.8 F (37.1 C)] 97.8 F (36.6 C) (03/06 0754) Pulse Rate:  [72-100] 72 (03/06 0754) Resp:  [9-20] 20 (03/06 0309) BP: (114-137)/(54-96) 116/54 (03/06 0754) SpO2:  [97 %-100 %] 100 % (03/06 0754)  Intake/Output from previous day: 03/05 0701 - 03/06 0700 In: 700 [I.V.:700] Out: 315 [Urine:215; Blood:100] Intake/Output this shift: No intake/output data recorded.  Recent Labs    04/25/18 0556  HGB 12.0*   Recent Labs    04/25/18 0556  WBC 9.1  RBC 3.98*  HCT 38.1*  PLT 240   Recent Labs    04/25/18 0556  NA 134*  K 4.2  CL 101  CO2 22  BUN 18  CREATININE 1.55*  GLUCOSE 150*  CALCIUM 8.6*   No results for input(s): LABPT, INR in the last 72 hours.  Neurologically intact ABD soft Neurovascular intact Sensation intact distally Intact pulses distally Dorsiflexion/Plantar flexion intact Incision: dressing C/D/I and no drainage No cellulitis present Compartment soft no calf pain or sign of DVT   Assessment/Plan: 1 Day Post-Op Procedure(s) (LRB): Microlumbar decompression Lumbar four-five and Lumbar five-sacral one (N/A) Advance diet Up with therapy D/C IV fluids Discussed D/C instructions, dressing instructions, Lspine precautions Will D/C home today [facility]- paper Rx on chart Discussed with Dr. Shelle Iron Follow up 10-14 days post-op for staple removal  Dorothy Spark 04/25/2018, 7:57 AM

## 2018-04-25 NOTE — Progress Notes (Signed)
Patient alert and oriented, mae's well, voiding adequate amount of urine, swallowing without difficulty, no c/o pain at time of discharge. Patient discharged home with family. Script and discharged instructions given to patient. Patient and family stated understanding of instructions given. Patient has an appointment with Dr. Beane ?

## 2018-04-25 NOTE — Progress Notes (Addendum)
Physical Therapy Treatment Patient Details Name: Dale Le MRN: [id number] DOB: December 03, 1945 Today's Date: 04/25/2018    History of Present Illness Pt is a 73 y.o. M with no significant PMH on file who presents s/p microlumbar decompression L4-5, L5-S1    PT Comments    Pt declining any OOB activity or stair training at this time as he just recently returned from ambulating in hallway and requesting to rest. PT thoroughly reviewed back precautions with pt and how that relates to functional tasks and mobility. PT also discussed a generalized walking program for pt to initiate upon d/c home. PT demonstrated and instructed pt in safe car transfer while maintaining back precautions as well. Plan is for pt to d/c home today with daughter.      Follow Up Recommendations  No PT follow up;Supervision - Intermittent     Equipment Recommendations  3in1 (PT)    Recommendations for Other Services       Precautions / Restrictions Precautions Precautions: Fall;Back Precaution Booklet Issued: Yes (comment) Precaution Comments: pt able to recall 2/3 back precautions Restrictions Weight Bearing Restrictions: No    Mobility  Bed Mobility Overal bed mobility: Modified Independent             General bed mobility comments: increased time  Transfers Overall transfer level: Needs assistance Equipment used: None Transfers: Sit to/from Stand Sit to Stand: Modified independent (Device/Increase time)         General transfer comment: pt declining as he reported that he just recently ambulated in hallway and wanted to rest  Ambulation/Gait                 Stairs             Wheelchair Mobility    Modified Rankin (Stroke Patients Only)       Balance Overall balance assessment: Modified Independent                                          Cognition Arousal/Alertness: Awake/alert Behavior During Therapy: WFL for tasks  assessed/performed Overall Cognitive Status: Within Functional Limits for tasks assessed                                        Exercises      General Comments General comments (skin integrity, edema, etc.): reports family can check in on him, but plans to be independent and eager to return to gym,      Pertinent Vitals/Pain Pain Assessment: Faces Faces Pain Scale: No hurt Pain Location: left hip/glute at end of ambulation Pain Descriptors / Indicators: Tender Pain Intervention(s): Repositioned    Home Living Family/patient expects to be discharged to:: Private residence Living Arrangements: Alone Available Help at Discharge: Family;Available PRN/intermittently(daughter) Type of Home: House Home Access: Stairs to enter   Home Layout: Two level Home Equipment: Cane - single point      Prior Function Level of Independence: Independent with assistive device(s)      Comments: has been using cane recently due to pain. previously went to the gym 5x/week   PT Goals (current goals can now be found in the care plan section) Acute Rehab PT Goals Patient Stated Goal: "get back to the gym." PT Goal Formulation: With patient Time For Goal Achievement: 04/29/18 Potential  to Achieve Goals: Good Progress towards PT goals: Progressing toward goals    Frequency    Min 5X/week      PT Plan Current plan remains appropriate    Co-evaluation              AM-PAC PT "6 Clicks" Mobility   Outcome Measure  Help needed turning from your back to your side while in a flat bed without using bedrails?: None Help needed moving from lying on your back to sitting on the side of a flat bed without using bedrails?: None Help needed moving to and from a bed to a chair (including a wheelchair)?: None Help needed standing up from a chair using your arms (e.g., wheelchair or bedside chair)?: A Little Help needed to walk in hospital room?: A Little Help needed climbing 3-5  steps with a railing? : A Little 6 Click Score: 21    End of Session   Activity Tolerance: Patient tolerated treatment well Patient left: in bed;with call bell/phone within reach Nurse Communication: Mobility status PT Visit Diagnosis: Other abnormalities of gait and mobility (R26.89);Difficulty in walking, not elsewhere classified (R26.2)     Time: 8592-9244 PT Time Calculation (min) (ACUTE ONLY): 19 min  Charges:  $Self Care/Home Management: 8-22                     Deborah Chalk, PT, DPT  Acute Rehabilitation Services Pager [phone] Office [phone]     Dale Le 04/25/2018, 10:42 AM

## 2018-04-26 NOTE — Op Note (Signed)
NAME: Dale Le, Dale Le. MEDICAL RECORD GS:[id number] ACCOUNT [id number] DATE OF BIRTH:Apr 15, 1945 FACILITY: [facility] LOCATION: [facility] PHYSICIAN:Jun Osment Connye Burkitt, MD  OPERATIVE REPORT  DATE OF PROCEDURE:  04/24/2018  PREOPERATIVE DIAGNOSIS:  Spinal stenosis L4-L5 and L5-S1.  POSTOPERATIVE DIAGNOSIS:  Spinal stenosis L4-L5 and L5-S1.  PROCEDURE PERFORMED:  Microlumbar decompression at L4-L5 and L5-S1.  ANESTHESIA:  General.  ASSISTANT:  Andrez Grime, PA  HISTORY OF PRESENT ILLNESS:  This pleasant 73 year old male with predominantly L5 radicular pain, some L4 radicular pain secondary to lateral recess stenosis and spinal stenosis at L4-L5.  He had been refractory to conservative treatment including rest,  activity modification, physical therapy.  Neuro tension signs, EHL weakness predominantly on the left.  We discussed microlumbar decompression at L4-L5 and L5-S1 to decompress the lateral recess of L4-L5 and at 5-1.  At L4-L5 and possibly at L5-S1 to  extend the decompression of L5.  It was compressed in the lateral recess at L4-L5 and perhaps in the foramen of L5.  He had end-stage disk degeneration of L5 with some neural foraminal stenosis.  He had predominantly leg pain as opposed to back pain.  We  discussed the risks and benefits including bleeding, infection, damage to neurovascular structures, no change in symptoms, worsening symptoms, DVT, PE, anesthetic complications, etc.  TECHNIQUE:  With the patient in supine position after induction of adequate general anesthesia, 2 g Kefzol, Foley to gravity, placed prone on the Ney table, knees flexed.  All surfaces were well padded.  Abdomen free.  Lumbar region was prepped and  draped in the usual sterile fashion.  Two 18-gauge spinal needles were utilized to localize L4-L5, 5-1 interspace.  Incision was made from the spinous process of above L4 to S1.  Subcutaneous tissue was dissected.  Electrocautery was utilized to achieve  hemostasis.   Dorsal lumbar fascia was in the paraspinous musculature, infiltrated with 0.25% Marcaine with epinephrine.  We divided the dorsal lumbar fascia in line with the skin incision.  Paraspinous muscles elevated from lamina of L4-L5 and 5-1.   Operating microscope was draped and brought in the surgical field.  The patient has fairly narrow interlaminar windows.  I used a Leksell rongeur to remove the inferior process of L4.  I used a micro curette to detach the ligamentum flavum from the cephalad edge of L5.  The bipolar electrocautery was utilized to achieve  hemostasis, decompressing L4-L5.  He had hypertrophic ligamentum and facet arthropathy.  We will continue with a hemilaminotomy.  The neural elements were compressed into the lateral recess.  Initially, we began decompression at 5-1 with hemilaminotomies  of the caudad edge of 5.  I detached ligamentum flavum from the cephalad edge of 5.  Hemilaminotomies were performed there.  Stenosis was noted into the lateral recess.  No disk herniation was noted.  It was fairly narrow at this disk space to  decompress the lateral recess to the medial border of the pedicle.  The ligamentum flavum was hypertrophied initially.  The radiographs intraoperatively were noted, after further reflection on the intraoperative images into his disk degeneration, needed  to go distally.  There were small interlaminar windows and the shingling effect.  A foraminotomy of L4 was performed as well as the decompression at L4-L5.  I then went distally just below the L5 lamina.  There was a small space distally.  I performed a  hemilaminotomy at 5, preserving the pars, removing ligamentum flavum and performing a foraminotomy at 5.  The 5 root was noted  to be compressing the lateral recess at L4-L5 and at 5-1.  I performed a foraminotomy of L5.  There was no significant stenosis  noted at S1 distally.  This was consistent with that seen on the MRI.  Following this, the neuro probe passed  freely out the foramen of 5, and at L4-L5, there was no disk herniation.  This was mainly the ligamentum flavum hypertrophy and the facet  hypertrophy.  There was a narrow pars at L4 and one above the lamina of L4 as we had entered into the L3-L4 space and followed the L4 root from the L3-L4 level, ensuring adequate exiting of the L4 root without compression.  I felt that the L4 root was  well decompressed as well, and at L5-S1.  Bone wax was placed on the cancellous surfaces.  No evidence of CSF leakage or active bleeding.  The pars were intact.  The facet was intact.  Less than 30% of the L4-L5 facet was decompressed.  Thrombin-soaked Gelfoam and small patties were placed in the laminotomy defects.  I then copiously irrigated the wound.  I closed the dorsal lumbar fascia with #1 Vicryl interrupted figure-of-eight sutures, subcu with 2-0, and skin with staples, placed a Hemovac and brought out through lateral stab wound in the skin.  A sterile  dressing was applied.  Placed supine on the hospital bed, extubated without difficulty, and transported to the recovery room in satisfactory condition.  The patient tolerated the procedure well.  No complications.  Assistant Andrez Grime, [place].  Marland Kitchen  Blood loss was 100 mL.  LN/NUANCE  D:04/26/2018 T:04/26/2018 JOB:[id number]

## 2018-04-26 NOTE — Brief Op Note (Signed)
04/24/2018  8:35 AM  PATIENT:  Dale Le  73 y.o. male  PRE-OPERATIVE DIAGNOSIS:  Spinal stenosis Lumbar four-Sacral one  POST-OPERATIVE DIAGNOSIS:  Spinal stenosis Lumbar four-Sacral one  PROCEDURE:  Procedure(s): Microlumbar decompression Lumbar four-five and Lumbar five-sacral one (N/A)  SURGEON:  Surgeon(s) and Role:    Jene Every, MD - Primary  PHYSICIAN ASSISTANT:   ASSISTANTS: Bissell   ANESTHESIA:   general  EBL:  100 mL   BLOOD ADMINISTERED:none  DRAINS:  hemovac  LOCAL MEDICATIONS USED:  MARCAINE     SPECIMEN:  No Specimen  DISPOSITION OF SPECIMEN:  PATHOLOGY  COUNTS:  YES  TOURNIQUET:  * No tourniquets in log *  DICTATION: .Other Dictation: Dictation Number [id number]  PLAN OF CARE: Admit for overnight observation  PATIENT DISPOSITION:  PACU - hemodynamically stable.   Delay start of Pharmacological VTE agent (>24hrs) due to surgical blood loss or risk of bleeding: yes

## 2019-09-29 HISTORY — PX: KNEE ARTHROPLASTY: SHX992

## 2020-05-28 IMAGING — CR DG LUMBAR SPINE 2-3V
2 series · 2 of 2 positions shown · non-contrast
Comparison: None.

CLINICAL DATA: Preop evaluation for upcoming lumbar surgery

EXAM:
LUMBAR SPINE - 2-3 VIEW

[w lumbar spine ap (1 of 2)]
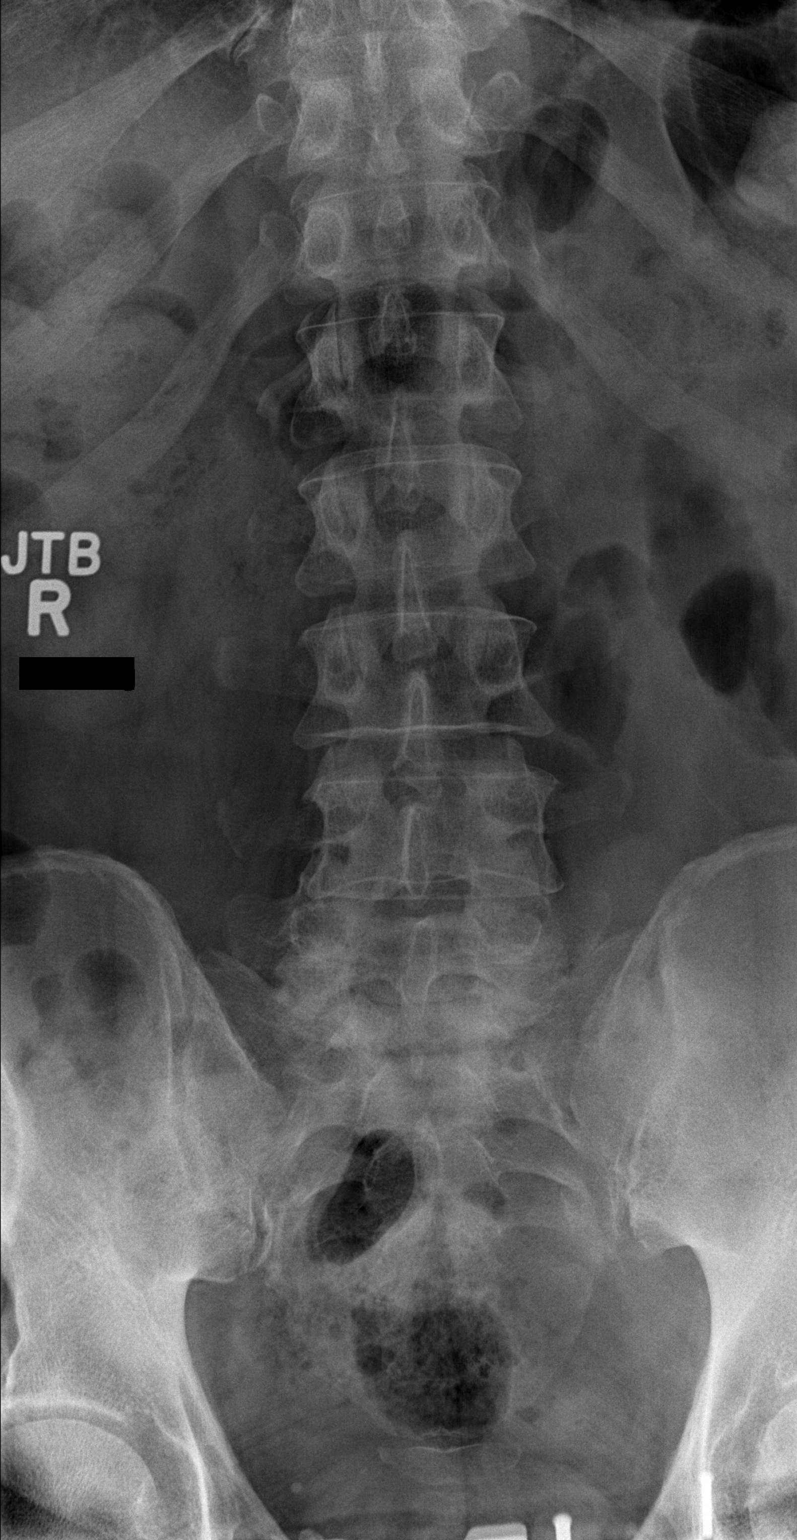

[w lumbar spine ap (2 of 2)]
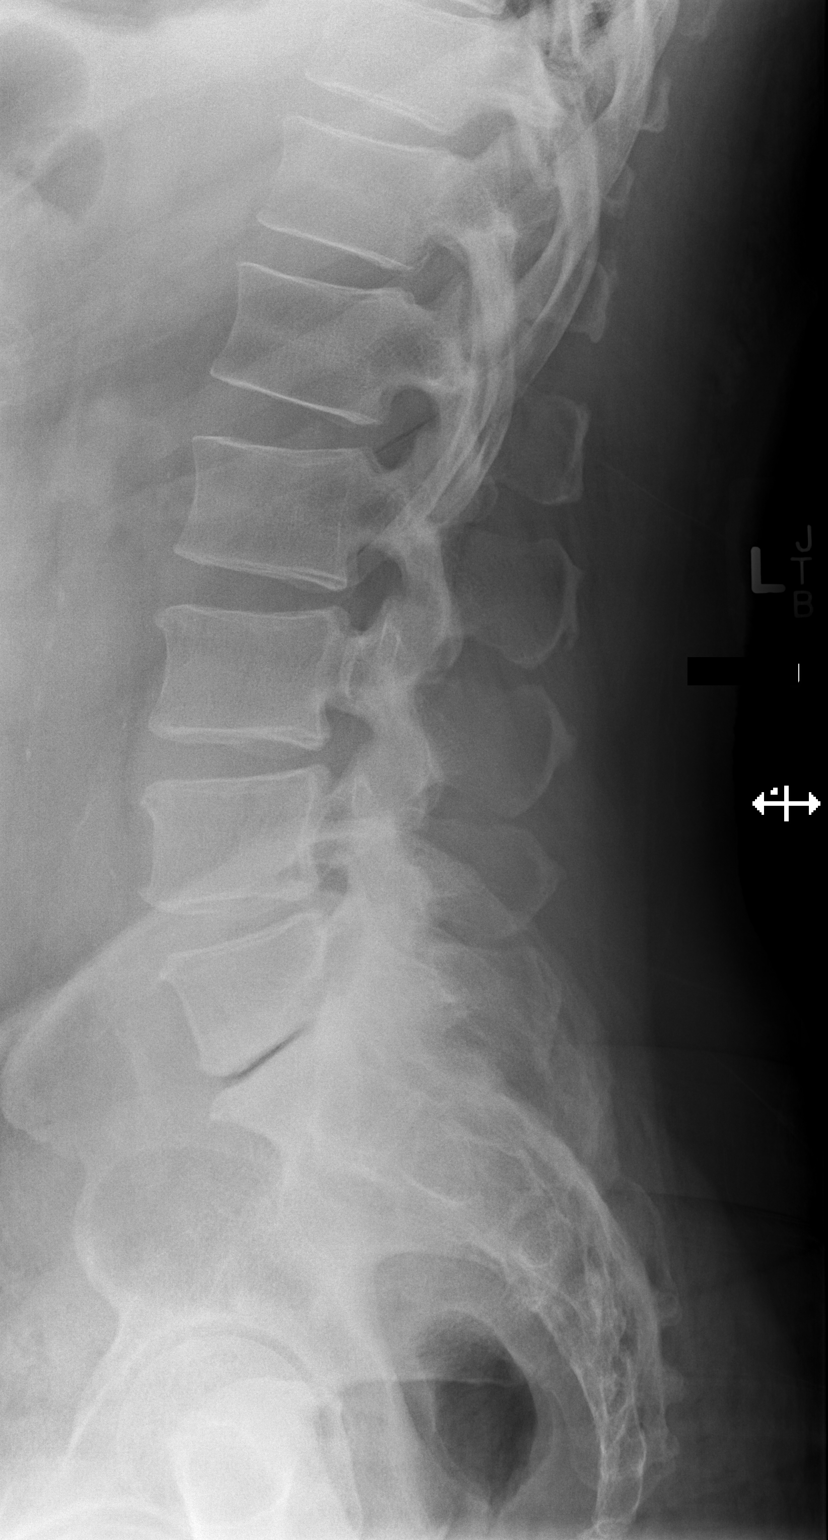

[2 of 2 positions shown; findings below may reference images not displayed]

FINDINGS: Five lumbar type vertebral bodies are well visualized. Numbering
nomenclature follow standard numbering. Disc space narrowing is
noted at L4-5 and to a greater degree at L5-S1 with vacuum disc
phenomena. Mild osteophytic changes are seen. No anterolisthesis is
noted. No soft tissue abnormality is noted.
IMPRESSION: Degenerative change of lumbar spine.

## 2022-02-20 NOTE — Progress Notes (Signed)
Sent message, via epic in basket, requesting orders in epic from surgeon.  

## 2022-02-21 ENCOUNTER — Ambulatory Visit: Payer: Self-pay | Admitting: Student

## 2022-02-21 DIAGNOSIS — E119 Type 2 diabetes mellitus without complications: Secondary | ICD-10-CM

## 2022-02-26 NOTE — Patient Instructions (Signed)
DUE TO COVID-19 ONLY TWO VISITORS  (aged 77 and older)  ARE ALLOWED TO COME WITH YOU AND STAY IN THE WAITING ROOM ONLY DURING PRE OP AND PROCEDURE.   **NO VISITORS ARE ALLOWED IN THE SHORT STAY AREA OR RECOVERY ROOM!!**  IF YOU WILL BE ADMITTED INTO THE HOSPITAL YOU ARE ALLOWED ONLY FOUR SUPPORT PEOPLE DURING VISITATION HOURS ONLY (7 AM -8PM)   The support person(s) must pass our screening, gel in and out, and wear a mask at all times, including in the patient's room. Patients must also wear a mask when staff or their support person are in the room. Visitors GUEST BADGE MUST BE WORN VISIBLY  One adult visitor may remain with you overnight and MUST be in the room by 8 P.M.     Your procedure is scheduled on: 03/08/22   Report to [facility] Main Entrance    Report to admitting at : 5:15 AM   Call this number if you have problems the morning of surgery [phone]   Do not eat food :After Midnight.   After Midnight you may have the following liquids until : 4:30 AM DAY OF SURGERY  Water Black Coffee (sugar ok, NO MILK/CREAM OR CREAMERS)  Tea (sugar ok, NO MILK/CREAM OR CREAMERS) regular and decaf                             Plain Jell-O (NO RED)                                           Fruit ices (not with fruit pulp, NO RED)                                     Popsicles (NO RED)                                                                  Juice: apple, WHITE grape, WHITE cranberry Sports drinks like Gatorade (NO RED)     The day of surgery:  Drink ONE (1) Pre-Surgery Clear Ensure or G2 at : 4:30 AM the morning of surgery. Drink in one sitting. Do not sip.  This drink was given to you during your hospital  pre-op appointment visit. Nothing else to drink after completing the  Pre-Surgery Clear Ensure or G2.          If you have questions, please contact your surgeon's office.  Oral Hygiene is also important to reduce your risk of infection.                                     Remember - BRUSH YOUR TEETH THE MORNING OF SURGERY WITH YOUR REGULAR TOOTHPASTE  DENTURES WILL BE REMOVED PRIOR TO SURGERY PLEASE DO NOT APPLY "Poly grip" OR ADHESIVES!!!   Do NOT smoke after Midnight   Take these medicines the morning of surgery with A SIP OF WATER: Tylenol and cetirizine as needed.  DO NOT TAKE ANY ORAL DIABETIC MEDICATIONS DAY OF YOUR SURGERY  Bring CPAP mask and tubing day of surgery.                              You may not have any metal on your body including hair pins, jewelry, and body piercing             Do not wear lotions, powders, perfumes/cologne, or deodorant              Men may shave face and neck.   Do not bring valuables to the hospital. [facility] IS NOT             RESPONSIBLE   FOR VALUABLES.   Contacts, glasses, or bridgework may not be worn into surgery.   Bring small overnight bag day of surgery.   DO NOT BRING YOUR HOME MEDICATIONS TO THE HOSPITAL. PHARMACY WILL DISPENSE MEDICATIONS LISTED ON YOUR MEDICATION LIST TO YOU DURING YOUR ADMISSION IN THE HOSPITAL!    Patients discharged on the day of surgery will not be allowed to drive home.  Someone NEEDS to stay with you for the first 24 hours after anesthesia.   Special Instructions: Bring a copy of your healthcare power of attorney and living will documents         the day of surgery if you haven't scanned them before.              Please read over the following fact sheets you were given: IF YOU HAVE QUESTIONS ABOUT YOUR PRE-OP INSTRUCTIONS PLEASE CALL [phone]    [facility] - Preparing for Surgery Before surgery, you can play an important role.  Because skin is not sterile, your skin needs to be as free of germs as possible.  You can reduce the number of germs on your skin by washing with CHG (chlorahexidine gluconate) soap before surgery.  CHG is an antiseptic cleaner which kills germs and bonds with the skin to continue killing germs even after washing. Please DO NOT  use if you have an allergy to CHG or antibacterial soaps.  If your skin becomes reddened/irritated stop using the CHG and inform your nurse when you arrive at Short Stay. Do not shave (including legs and underarms) for at least 48 hours prior to the first CHG shower.  You may shave your face/neck. Please follow these instructions carefully:  1.  Shower with CHG Soap the night before surgery and the  morning of Surgery.  2.  If you choose to wash your hair, wash your hair first as usual with your  normal  shampoo.  3.  After you shampoo, rinse your hair and body thoroughly to remove the  shampoo.                           4.  Use CHG as you would any other liquid soap.  You can apply chg directly  to the skin and wash                       Gently with a scrungie or clean washcloth.  5.  Apply the CHG Soap to your body ONLY FROM THE NECK DOWN.   Do not use on face/ open  Wound or open sores. Avoid contact with eyes, ears mouth and genitals (private parts).                       Wash face,  Genitals (private parts) with your normal soap.             6.  Wash thoroughly, paying special attention to the area where your surgery  will be performed.  7.  Thoroughly rinse your body with warm water from the neck down.  8.  DO NOT shower/wash with your normal soap after using and rinsing off  the CHG Soap.                9.  Pat yourself dry with a clean towel.            10.  Wear clean pajamas.            11.  Place clean sheets on your bed the night of your first shower and do not  sleep with pets. Day of Surgery : Do not apply any lotions/deodorants the morning of surgery.  Please wear clean clothes to the hospital/surgery center.  FAILURE TO FOLLOW THESE INSTRUCTIONS MAY RESULT IN THE CANCELLATION OF YOUR SURGERY PATIENT SIGNATURE_________________________________  NURSE  SIGNATURE__________________________________  ________________________________________________________________________  Dale Le  An incentive spirometer is a tool that can help keep your lungs clear and active. This tool measures how well you are filling your lungs with each breath. Taking long deep breaths may help reverse or decrease the chance of developing breathing (pulmonary) problems (especially infection) following: A long period of time when you are unable to move or be active. BEFORE THE PROCEDURE  If the spirometer includes an indicator to show your best effort, your nurse or respiratory therapist will set it to a desired goal. If possible, sit up straight or lean slightly forward. Try not to slouch. Hold the incentive spirometer in an upright position. INSTRUCTIONS FOR USE  Sit on the edge of your bed if possible, or sit up as far as you can in bed or on a chair. Hold the incentive spirometer in an upright position. Breathe out normally. Place the mouthpiece in your mouth and seal your lips tightly around it. Breathe in slowly and as deeply as possible, raising the piston or the ball toward the top of the column. Hold your breath for 3-5 seconds or for as long as possible. Allow the piston or ball to fall to the bottom of the column. Remove the mouthpiece from your mouth and breathe out normally. Rest for a few seconds and repeat Steps 1 through 7 at least 10 times every 1-2 hours when you are awake. Take your time and take a few normal breaths between deep breaths. The spirometer may include an indicator to show your best effort. Use the indicator as a goal to work toward during each repetition. After each set of 10 deep breaths, practice coughing to be sure your lungs are clear. If you have an incision (the cut made at the time of surgery), support your incision when coughing by placing a pillow or rolled up towels firmly against it. Once you are able to get out of  bed, walk around indoors and cough well. You may stop using the incentive spirometer when instructed by your caregiver.  RISKS AND COMPLICATIONS Take your time so you do not get dizzy or light-headed. If you are in pain, you may need to take or ask  for pain medication before doing incentive spirometry. It is harder to take a deep breath if you are having pain. AFTER USE Rest and breathe slowly and easily. It can be helpful to keep track of a log of your progress. Your caregiver can provide you with a simple table to help with this. If you are using the spirometer at home, follow these instructions: [place] IF:  You are having difficultly using the spirometer. You have trouble using the spirometer as often as instructed. Your pain medication is not giving enough relief while using the spirometer. You develop fever of 100.5 F (38.1 C) or higher. SEEK IMMEDIATE MEDICAL CARE IF:  You cough up bloody sputum that had not been present before. You develop fever of 102 F (38.9 C) or greater. You develop worsening pain at or near the incision site. MAKE SURE YOU:  Understand these instructions. Will watch your condition. Will get help right away if you are not doing well or get worse. Document Released: 06/18/2006 Document Revised: 04/30/2011 Document Reviewed: 08/19/2006 [facility] Patient Information 2014 [place], [place].   ________________________________________________________________________

## 2022-02-27 ENCOUNTER — Encounter (HOSPITAL_COMMUNITY)
Admission: RE | Admit: 2022-02-27 | Discharge: 2022-02-27 | Disposition: A | Discharge status: Home / Self Care | Payer: Non-veteran care | Source: Ambulatory Visit | Attending: Anesthesiology | Admitting: Anesthesiology

## 2022-02-27 DIAGNOSIS — Z01818 Encounter for other preprocedural examination: Secondary | ICD-10-CM

## 2022-02-27 DIAGNOSIS — R7303 Prediabetes: Secondary | ICD-10-CM

## 2022-02-27 DIAGNOSIS — I2510 Atherosclerotic heart disease of native coronary artery without angina pectoris: Secondary | ICD-10-CM

## 2022-03-01 ENCOUNTER — Other Ambulatory Visit: Payer: Self-pay

## 2022-03-01 ENCOUNTER — Encounter (HOSPITAL_COMMUNITY): Payer: Self-pay

## 2022-03-01 ENCOUNTER — Encounter (HOSPITAL_COMMUNITY)
Admission: RE | Admit: 2022-03-01 | Discharge: 2022-03-01 | Disposition: A | Discharge status: Home / Self Care | Payer: No Typology Code available for payment source | Source: Ambulatory Visit | Attending: Orthopedic Surgery | Admitting: Orthopedic Surgery

## 2022-03-01 DIAGNOSIS — Z01818 Encounter for other preprocedural examination: Secondary | ICD-10-CM

## 2022-03-01 DIAGNOSIS — R7303 Prediabetes: Secondary | ICD-10-CM

## 2022-03-01 DIAGNOSIS — I2510 Atherosclerotic heart disease of native coronary artery without angina pectoris: Secondary | ICD-10-CM

## 2022-03-01 DIAGNOSIS — E119 Type 2 diabetes mellitus without complications: Secondary | ICD-10-CM | POA: Diagnosis not present

## 2022-03-01 HISTORY — DX: Depression, unspecified: F32.A

## 2022-03-01 LAB — BASIC METABOLIC PANEL
Anion gap: 8 (ref 5–15)
BUN: 24 mg/dL — ABNORMAL HIGH (ref 8–23)
CO2: 22 mmol/L (ref 22–32)
Calcium: 9 mg/dL (ref 8.9–10.3)
Chloride: 108 mmol/L (ref 98–111)
Creatinine, Ser: 1.8 mg/dL — ABNORMAL HIGH (ref 0.61–1.24)
GFR, Estimated: 39 mL/min — ABNORMAL LOW (ref >60)
Glucose, Bld: 119 mg/dL — ABNORMAL HIGH (ref 70–99)
Potassium: 4.4 mmol/L (ref 3.5–5.1)
Sodium: 138 mmol/L (ref 135–145)

## 2022-03-01 LAB — CBC
HCT: 39 % (ref 39.0–52.0)
Hemoglobin: 12.4 g/dL — ABNORMAL LOW (ref 13.0–17.0)
MCH: 30.8 pg (ref 26.0–34.0)
MCHC: 31.8 g/dL (ref 30.0–36.0)
MCV: 96.8 fL (ref 80.0–100.0)
Platelets: 241 10*3/uL (ref 150–400)
RBC: 4.03 MIL/uL — ABNORMAL LOW (ref 4.22–5.81)
RDW: 14.4 % (ref 11.5–15.5)
WBC: 3.8 10*3/uL — ABNORMAL LOW (ref 4.0–10.5)
nRBC: 0 % (ref 0.0–0.2)

## 2022-03-01 LAB — HEMOGLOBIN A1C
Hgb A1c MFr Bld: 6.1 % — ABNORMAL HIGH (ref 4.8–5.6)
Mean Plasma Glucose: 128.37 mg/dL

## 2022-03-01 LAB — SURGICAL PCR SCREEN
MRSA, PCR: NEGATIVE
Staphylococcus aureus: NEGATIVE

## 2022-03-01 LAB — GLUCOSE, CAPILLARY: Glucose-Capillary: 124 mg/dL — ABNORMAL HIGH (ref 70–99)

## 2022-03-01 NOTE — Progress Notes (Signed)
For Short Stay: [place] appointment date:  Bowel Prep reminder:   For Anesthesia: PCP - [facility] Cardiologist -   Chest x-ray -  EKG -  Stress Test -  ECHO -  Cardiac Cath -  Pacemaker/ICD device last checked: Pacemaker orders received: Device Rep notified:  Spinal Cord Stimulator:  Sleep Study - Yes CPAP - Yes  Fasting Blood Sugar - 90 - 110 Checks Blood Sugar __1___ times a week Date and result of last Hgb A1c-  Last dose of GLP1 agonist-  GLP1 instructions:   Last dose of SGLT-2 inhibitors-  SGLT-2 instructions:   Blood Thinner Instructions: Aspirin Instructions: Last Dose:  Activity level: Can go up a flight of stairs and activities of daily living without stopping and without chest pain and/or shortness of breath   Able to exercise without chest pain and/or shortness of breath   Unable to go up a flight of stairs without chest pain and/or shortness of breath     Anesthesia review: Hx: DIA,HTN  Patient denies shortness of breath, fever, cough and chest pain at PAT appointment   Patient verbalized understanding of instructions that were given to them at the PAT appointment. Patient was also instructed that they will need to review over the PAT instructions again at home before surgery.

## 2022-03-01 NOTE — Progress Notes (Signed)
Lab results: Creatinine: 1.80

## 2022-03-06 ENCOUNTER — Ambulatory Visit: Payer: Self-pay | Admitting: Student

## 2022-03-06 NOTE — H&P (View-Only) (Signed)
TOTAL KNEE ADMISSION H&P  Patient is being admitted for left total knee arthroplasty.  Subjective:  Chief Complaint:left knee pain.  HPI: Dale Le, 77 y.o. male, has a history of pain and functional disability in the left knee due to arthritis and has failed non-surgical conservative treatments for greater than 12 weeks to includeNSAID's and/or analgesics, corticosteriod injections, flexibility and strengthening excercises, use of assistive devices, and activity modification.  Onset of symptoms was gradual, starting 10 years ago with rapidlly worsening course since that time. The patient noted no past surgery on the left knee(s).  Patient currently rates pain in the left knee(s) at 10 out of 10 with activity. Patient has night pain, worsening of pain with activity and weight bearing, pain that interferes with activities of daily living, pain with passive range of motion, crepitus, and joint swelling.  Patient has evidence of subchondral cysts, subchondral sclerosis, periarticular osteophytes, and joint space narrowing by imaging studies. There is no active infection.  Patient Active Problem List   Diagnosis Date Noted   Spinal stenosis of lumbar region 04/24/2018   Spinal stenosis at L4-L5 level 04/24/2018   Past Medical History:  Diagnosis Date   Anxiety    PTSD   Arthritis    Depression    Diabetes mellitus without complication ([place])    type 2   Hyperlipemia    Hypertension     Past Surgical History:  Procedure Laterality Date   KNEE ARTHROPLASTY Right 09/29/2019   LUMBAR LAMINECTOMY/DECOMPRESSION MICRODISCECTOMY N/A 04/24/2018   Procedure: Microlumbar decompression Lumbar four-five and Lumbar five-sacral one;  Surgeon: Susa Day, MD;  Location: [place];  Service: Orthopedics;  Laterality: N/A;    Current Outpatient Medications  Medication Sig Dispense Refill Last Dose   acetaminophen (TYLENOL) 325 MG tablet Take 650 mg by mouth every 6 (six) hours as needed for  moderate pain or headache.      cetirizine (ZYRTEC) 10 MG tablet Take 10 mg by mouth daily as needed for allergies.      diphenhydrAMINE (BENADRYL) 25 MG tablet Take 25 mg by mouth at bedtime as needed for allergies or sleep.       Multiple Vitamin (MULITIVITAMIN WITH MINERALS) TABS Take 1 tablet by mouth daily.      polyethylene glycol (MIRALAX) packet Take 17 g by mouth daily. (Patient taking differently: Take 17 g by mouth daily as needed for moderate constipation.) 14 each 0    traZODone (DESYREL) 50 MG tablet Take 50 mg by mouth at bedtime.      No current facility-administered medications for this visit.   No Known Allergies  Social History   Tobacco Use   Smoking status: Former   Smokeless tobacco: Never  Substance Use Topics   Alcohol use: No    No family history on file.   Review of Systems  Musculoskeletal:  Positive for arthralgias, gait problem and joint swelling.  All other systems reviewed and are negative.   Objective:  Physical Exam Constitutional:      Appearance: Normal appearance.  HENT:     Head: Normocephalic and atraumatic.     Nose: Nose normal.     Mouth/Throat:     Mouth: Mucous membranes are moist.     Pharynx: Oropharynx is clear.  Eyes:     Conjunctiva/sclera: Conjunctivae normal.  Cardiovascular:     Rate and Rhythm: Normal rate and regular rhythm.     Pulses: Normal pulses.     Heart sounds: Normal heart sounds.  Pulmonary:     Effort: Pulmonary effort is normal.     Breath sounds: Normal breath sounds.  Abdominal:     General: Abdomen is flat.     Palpations: Abdomen is soft.  Genitourinary:    Comments: Deferred. Musculoskeletal:     Cervical back: Normal range of motion and neck supple.     Comments: Examination of the left knee reveals no skin wounds or lesions. He has swelling and a large effusion. No warmth or erythema. He has diffuse tenderness to palpation, worse over the medial joint line. Range of motion is 0 to 120 degrees  without any ligamentous instability. Painless range of motion of the hip.  Neurovascular intact distally.  He ambulates with an antalgic gait.  Skin:    General: Skin is warm and dry.     Capillary Refill: Capillary refill takes less than 2 seconds.  Neurological:     General: No focal deficit present.     Mental Status: He is alert and oriented to person, place, and time.  Psychiatric:        Mood and Affect: Mood normal.        Thought Content: Thought content normal.        Judgment: Judgment normal.     Vital signs in last 24 hours: @VSRANGES@  Labs:   Estimated body mass index is 34.43 kg/m as calculated from the following:   Height as of 03/01/22: 6' 1" (1.854 m).   Weight as of 03/01/22: 118.4 kg.   Imaging Review Plain radiographs demonstrate severe degenerative joint disease of the left knee(s). The overall alignment ismild varus. The bone quality appears to be adequate for age and reported activity level.      Assessment/Plan:  End stage arthritis, left knee   The patient history, physical examination, clinical judgment of the provider and imaging studies are consistent with end stage degenerative joint disease of the left knee(s) and total knee arthroplasty is deemed medically necessary. The treatment options including medical management, injection therapy arthroscopy and arthroplasty were discussed at length. The risks and benefits of total knee arthroplasty were presented and reviewed. The risks due to aseptic loosening, infection, stiffness, patella tracking problems, thromboembolic complications and other imponderables were discussed. The patient acknowledged the explanation, agreed to proceed with the plan and consent was signed. Patient is being admitted for inpatient treatment for surgery, pain control, PT, OT, prophylactic antibiotics, VTE prophylaxis, progressive ambulation and ADL's and discharge planning. The patient is planning to be discharged home with  OPPT.   Therapy Plans: outpatient therapy. [facility] 03/12/22 1st appointment. Rx provided today.  Disposition: Home with friend Planned DVT Prophylaxis: aspirin 81mg BID DME needed: walker. Iceman ice machine. [facility] and patient is going to pick up prior to surgery on Thursday. [facility]  PCP: Cleared TXA: IV Allergies: NDKA.  Anesthesia Concerns: None.  BMI: 34.6 Last HgbA1c: 6.1 Other: - T2DM - Pain management Dr. Ramos. Percocet 5/325. Patient will return to pain management 2 weeks postoperatively.  - OSA, uses CPAP - Oxycodone, zofran.  - 03/01/22: Hgb 12.4, Cr. 1.80 - NO NSAIDs - **[facility]    Patient's anticipated LOS is less than 2 midnights, meeting these requirements: - Younger than 65 - Lives within 1 hour of care - Has a competent adult at home to recover with post-op recover - NO history of  -   Chronic pain requiring opiods  - Diabetes  - Coronary Artery Disease  - Heart failure  - Heart attack  - Stroke  - DVT/VTE  - Cardiac arrhythmia  - Respiratory Failure/COPD  - Renal failure  - Anemia  - Advanced Liver disease

## 2022-03-06 NOTE — H&P (Signed)
TOTAL KNEE ADMISSION H&P  Patient is being admitted for left total knee arthroplasty.  Subjective:  Chief Complaint:left knee pain.  HPI: Dale Le, 77 y.o. male, has a history of pain and functional disability in the left knee due to arthritis and has failed non-surgical conservative treatments for greater than 12 weeks to includeNSAID's and/or analgesics, corticosteriod injections, flexibility and strengthening excercises, use of assistive devices, and activity modification.  Onset of symptoms was gradual, starting 10 years ago with rapidlly worsening course since that time. The patient noted no past surgery on the left knee(s).  Patient currently rates pain in the left knee(s) at 10 out of 10 with activity. Patient has night pain, worsening of pain with activity and weight bearing, pain that interferes with activities of daily living, pain with passive range of motion, crepitus, and joint swelling.  Patient has evidence of subchondral cysts, subchondral sclerosis, periarticular osteophytes, and joint space narrowing by imaging studies. There is no active infection.  Patient Active Problem List   Diagnosis Date Noted   Spinal stenosis of lumbar region 04/24/2018   Spinal stenosis at L4-L5 level 04/24/2018   Past Medical History:  Diagnosis Date   Anxiety    PTSD   Arthritis    Depression    Diabetes mellitus without complication ([place])    type 2   Hyperlipemia    Hypertension     Past Surgical History:  Procedure Laterality Date   KNEE ARTHROPLASTY Right 09/29/2019   LUMBAR LAMINECTOMY/DECOMPRESSION MICRODISCECTOMY N/A 04/24/2018   Procedure: Microlumbar decompression Lumbar four-five and Lumbar five-sacral one;  Surgeon: Susa Day, MD;  Location: [place];  Service: Orthopedics;  Laterality: N/A;    Current Outpatient Medications  Medication Sig Dispense Refill Last Dose   acetaminophen (TYLENOL) 325 MG tablet Take 650 mg by mouth every 6 (six) hours as needed for  moderate pain or headache.      cetirizine (ZYRTEC) 10 MG tablet Take 10 mg by mouth daily as needed for allergies.      diphenhydrAMINE (BENADRYL) 25 MG tablet Take 25 mg by mouth at bedtime as needed for allergies or sleep.       Multiple Vitamin (MULITIVITAMIN WITH MINERALS) TABS Take 1 tablet by mouth daily.      polyethylene glycol (MIRALAX) packet Take 17 g by mouth daily. (Patient taking differently: Take 17 g by mouth daily as needed for moderate constipation.) 14 each 0    traZODone (DESYREL) 50 MG tablet Take 50 mg by mouth at bedtime.      No current facility-administered medications for this visit.   No Known Allergies  Social History   Tobacco Use   Smoking status: Former   Smokeless tobacco: Never  Substance Use Topics   Alcohol use: No    No family history on file.   Review of Systems  Musculoskeletal:  Positive for arthralgias, gait problem and joint swelling.  All other systems reviewed and are negative.   Objective:  Physical Exam Constitutional:      Appearance: Normal appearance.  HENT:     Head: Normocephalic and atraumatic.     Nose: Nose normal.     Mouth/Throat:     Mouth: Mucous membranes are moist.     Pharynx: Oropharynx is clear.  Eyes:     Conjunctiva/sclera: Conjunctivae normal.  Cardiovascular:     Rate and Rhythm: Normal rate and regular rhythm.     Pulses: Normal pulses.     Heart sounds: Normal heart sounds.  Pulmonary:     Effort: Pulmonary effort is normal.     Breath sounds: Normal breath sounds.  Abdominal:     General: Abdomen is flat.     Palpations: Abdomen is soft.  Genitourinary:    Comments: Deferred. Musculoskeletal:     Cervical back: Normal range of motion and neck supple.     Comments: Examination of the left knee reveals no skin wounds or lesions. He has swelling and a large effusion. No warmth or erythema. He has diffuse tenderness to palpation, worse over the medial joint line. Range of motion is 0 to 120 degrees  without any ligamentous instability. Painless range of motion of the hip.  Neurovascular intact distally.  He ambulates with an antalgic gait.  Skin:    General: Skin is warm and dry.     Capillary Refill: Capillary refill takes less than 2 seconds.  Neurological:     General: No focal deficit present.     Mental Status: He is alert and oriented to person, place, and time.  Psychiatric:        Mood and Affect: Mood normal.        Thought Content: Thought content normal.        Judgment: Judgment normal.     Vital signs in last 24 hours: @VSRANGES @  Labs:   Estimated body mass index is 34.43 kg/m as calculated from the following:   Height as of 03/01/22: 6\' 1"  (1.854 m).   Weight as of 03/01/22: 118.4 kg.   Imaging Review Plain radiographs demonstrate severe degenerative joint disease of the left knee(s). The overall alignment ismild varus. The bone quality appears to be adequate for age and reported activity level.      Assessment/Plan:  End stage arthritis, left knee   The patient history, physical examination, clinical judgment of the provider and imaging studies are consistent with end stage degenerative joint disease of the left knee(s) and total knee arthroplasty is deemed medically necessary. The treatment options including medical management, injection therapy arthroscopy and arthroplasty were discussed at length. The risks and benefits of total knee arthroplasty were presented and reviewed. The risks due to aseptic loosening, infection, stiffness, patella tracking problems, thromboembolic complications and other imponderables were discussed. The patient acknowledged the explanation, agreed to proceed with the plan and consent was signed. Patient is being admitted for inpatient treatment for surgery, pain control, PT, OT, prophylactic antibiotics, VTE prophylaxis, progressive ambulation and ADL's and discharge planning. The patient is planning to be discharged home with  OPPT.   Therapy Plans: outpatient therapy. Nicole Kindred PT 03/12/22 1st appointment. Rx provided today.  Disposition: Home with friend Planned DVT Prophylaxis: aspirin 81mg  BID DME needed: walker. Iceman ice machine. Rx given to patient to take to the [place] prior to surgery and patient is going to pick up prior to surgery on Thursday. Copy also sent to [facility].  PCP: Cleared TXA: IV Allergies: NDKA.  Anesthesia Concerns: None.  BMI: 34.6 Last HgbA1c: 6.1 Other: - T2DM - Pain management Dr. Nelva Bush. Percocet 5/325. Patient will return to pain management 2 weeks postoperatively.  - OSA, uses CPAP - Oxycodone, zofran.  - 03/01/22: Hgb 12.4, Cr. 1.80 - NO NSAIDs - **Postoperative medication already sent to [place].**    Patient's anticipated LOS is less than 2 midnights, meeting these requirements: - Younger than 19 - Lives within 1 hour of care - Has a competent adult at home to recover with post-op recover - NO history of  -  Chronic pain requiring opiods  - Diabetes  - Coronary Artery Disease  - Heart failure  - Heart attack  - Stroke  - DVT/VTE  - Cardiac arrhythmia  - Respiratory Failure/COPD  - Renal failure  - Anemia  - Advanced Liver disease

## 2022-03-07 NOTE — Anesthesia Preprocedure Evaluation (Addendum)
Anesthesia Evaluation  Patient identified by MRN, date of birth, ID band Patient awake    Reviewed: Allergy & Precautions, NPO status , Patient's Chart, lab work & pertinent test results  Airway Mallampati: II  TM Distance: >3 FB Neck ROM: Full    Dental  (+) Dental Advisory Given, Teeth Intact   Pulmonary former smoker   Pulmonary exam normal breath sounds clear to auscultation       Cardiovascular hypertension, Normal cardiovascular exam Rhythm:Regular Rate:Normal     Neuro/Psych  PSYCHIATRIC DISORDERS Anxiety Depression    negative neurological ROS     GI/Hepatic negative GI ROS, Neg liver ROS,,,  Endo/Other  diabetes    Renal/GU negative Renal ROS     Musculoskeletal  (+) Arthritis ,    Abdominal  (+) + obese  Peds  Hematology negative hematology ROS (+)   Anesthesia Other Findings   Reproductive/Obstetrics                              Anesthesia Physical Anesthesia Plan  ASA: 3  Anesthesia Plan: Spinal   Post-op Pain Management: Tylenol PO (pre-op)*, Celebrex PO (pre-op)* and Regional block*   Induction: Intravenous  PONV Risk Score and Plan: 2 and Ondansetron, Dexamethasone, Treatment may vary due to age or medical condition, Propofol infusion and TIVA  Airway Management Planned: Natural Airway  Additional Equipment: None  Intra-op Plan:   Post-operative Plan:   Informed Consent: I have reviewed the patients History and Physical, chart, labs and discussed the procedure including the risks, benefits and alternatives for the proposed anesthesia with the patient or authorized representative who has indicated his/her understanding and acceptance.     Dental advisory given  Plan Discussed with: CRNA  Anesthesia Plan Comments:         Anesthesia Quick Evaluation

## 2022-03-08 ENCOUNTER — Encounter (HOSPITAL_COMMUNITY): Payer: Self-pay | Admitting: Orthopedic Surgery

## 2022-03-08 ENCOUNTER — Ambulatory Visit (HOSPITAL_BASED_OUTPATIENT_CLINIC_OR_DEPARTMENT_OTHER): Payer: No Typology Code available for payment source | Admitting: Anesthesiology

## 2022-03-08 ENCOUNTER — Encounter (HOSPITAL_COMMUNITY)
Admission: RE | Disposition: A | Discharge status: Home / Self Care | Payer: Self-pay | Source: Ambulatory Visit | Attending: Orthopedic Surgery

## 2022-03-08 ENCOUNTER — Ambulatory Visit (HOSPITAL_COMMUNITY): Payer: No Typology Code available for payment source | Admitting: Anesthesiology

## 2022-03-08 ENCOUNTER — Other Ambulatory Visit: Payer: Self-pay

## 2022-03-08 ENCOUNTER — Ambulatory Visit (HOSPITAL_COMMUNITY)
Admission: RE | Admit: 2022-03-08 | Discharge: 2022-03-09 | Disposition: A | Discharge status: Home / Self Care | Payer: No Typology Code available for payment source | Source: Ambulatory Visit | Attending: Orthopedic Surgery | Admitting: Orthopedic Surgery

## 2022-03-08 ENCOUNTER — Ambulatory Visit (HOSPITAL_COMMUNITY): Payer: No Typology Code available for payment source

## 2022-03-08 DIAGNOSIS — I10 Essential (primary) hypertension: Secondary | ICD-10-CM | POA: Insufficient documentation

## 2022-03-08 DIAGNOSIS — Z87891 Personal history of nicotine dependence: Secondary | ICD-10-CM

## 2022-03-08 DIAGNOSIS — E119 Type 2 diabetes mellitus without complications: Secondary | ICD-10-CM | POA: Insufficient documentation

## 2022-03-08 DIAGNOSIS — M1712 Unilateral primary osteoarthritis, left knee: Secondary | ICD-10-CM | POA: Diagnosis present

## 2022-03-08 DIAGNOSIS — F418 Other specified anxiety disorders: Secondary | ICD-10-CM

## 2022-03-08 HISTORY — PX: KNEE ARTHROPLASTY: SHX992

## 2022-03-08 LAB — GLUCOSE, CAPILLARY
Glucose-Capillary: 109 mg/dL — ABNORMAL HIGH (ref 70–99)
Glucose-Capillary: 137 mg/dL — ABNORMAL HIGH (ref 70–99)
Glucose-Capillary: 163 mg/dL — ABNORMAL HIGH (ref 70–99)
Glucose-Capillary: 133 mg/dL — ABNORMAL HIGH (ref 70–99)

## 2022-03-08 SURGERY — ARTHROPLASTY, KNEE, TOTAL, USING IMAGELESS COMPUTER-ASSISTED NAVIGATION
Anesthesia: Spinal | Site: Knee | Laterality: Left

## 2022-03-08 MED ORDER — TRAZODONE HCL 50 MG PO TABS
50.0000 mg | ORAL_TABLET | Freq: Every day | ORAL | Status: DC
  Administered 2022-03-08: 50 mg via ORAL
  Filled 2022-03-08: qty 1

## 2022-03-08 MED ORDER — INSULIN ASPART 100 UNIT/ML IJ SOLN: 0.0000 [IU] | Freq: Every day | INTRAMUSCULAR | Status: DC

## 2022-03-08 MED ORDER — CELECOXIB 200 MG PO CAPS
200.0000 mg | ORAL_CAPSULE | Freq: Two times a day (BID) | ORAL | Status: DC
  Administered 2022-03-08 – 2022-03-09 (×2): 200 mg via ORAL
  Filled 2022-03-08 (×2): qty 1

## 2022-03-08 MED ORDER — BUPIVACAINE-EPINEPHRINE 0.25% -1:200000 IJ SOLN
INTRAMUSCULAR | Status: DC | PRN
  Administered 2022-03-08: 30 mL

## 2022-03-08 MED ORDER — METOCLOPRAMIDE HCL 5 MG PO TABS: 5.0000 mg | ORAL_TABLET | Freq: Three times a day (TID) | ORAL | Status: DC | PRN

## 2022-03-08 MED ORDER — CEFAZOLIN SODIUM-DEXTROSE 2-4 GM/100ML-% IV SOLN
2.0000 g | Freq: Four times a day (QID) | INTRAVENOUS | Status: AC
  Administered 2022-03-08 (×2): 2 g via INTRAVENOUS
  Filled 2022-03-08 (×2): qty 100

## 2022-03-08 MED ORDER — METOCLOPRAMIDE HCL 5 MG/ML IJ SOLN: 5.0000 mg | Freq: Three times a day (TID) | INTRAMUSCULAR | Status: DC | PRN

## 2022-03-08 MED ORDER — KETOROLAC TROMETHAMINE 30 MG/ML IJ SOLN
INTRAMUSCULAR | Status: AC
  Filled 2022-03-08: qty 1

## 2022-03-08 MED ORDER — MIDAZOLAM HCL 5 MG/5ML IJ SOLN
INTRAMUSCULAR | Status: DC | PRN
  Administered 2022-03-08 (×2): 1 mg via INTRAVENOUS

## 2022-03-08 MED ORDER — POVIDONE-IODINE 10 % EX SWAB: 2.0000 | Freq: Once | CUTANEOUS | Status: AC

## 2022-03-08 MED ORDER — KETOROLAC TROMETHAMINE 30 MG/ML IJ SOLN
INTRAMUSCULAR | Status: DC | PRN
  Administered 2022-03-08: 30 mg via INTRAMUSCULAR

## 2022-03-08 MED ORDER — CLONIDINE HCL (ANALGESIA) 100 MCG/ML EP SOLN
EPIDURAL | Status: DC | PRN
  Administered 2022-03-08: 80 ug

## 2022-03-08 MED ORDER — PROPOFOL 1000 MG/100ML IV EMUL
INTRAVENOUS | Status: AC
  Filled 2022-03-08: qty 100

## 2022-03-08 MED ORDER — SENNA 8.6 MG PO TABS
1.0000 | ORAL_TABLET | Freq: Two times a day (BID) | ORAL | Status: DC
  Administered 2022-03-09: 8.6 mg via ORAL
  Filled 2022-03-08 (×2): qty 1

## 2022-03-08 MED ORDER — OXYCODONE HCL 5 MG PO TABS
5.0000 mg | ORAL_TABLET | ORAL | Status: DC | PRN
  Administered 2022-03-08 – 2022-03-09 (×4): 10 mg via ORAL
  Filled 2022-03-08 (×3): qty 2

## 2022-03-08 MED ORDER — ROPIVACAINE HCL 5 MG/ML IJ SOLN
INTRAMUSCULAR | Status: DC | PRN
  Administered 2022-03-08: 30 mL via PERINEURAL

## 2022-03-08 MED ORDER — ONDANSETRON HCL 4 MG PO TABS: 4.0000 mg | ORAL_TABLET | Freq: Four times a day (QID) | ORAL | Status: DC | PRN

## 2022-03-08 MED ORDER — ADULT MULTIVITAMIN W/MINERALS CH
1.0000 | ORAL_TABLET | Freq: Every day | ORAL | Status: DC
  Administered 2022-03-08 – 2022-03-09 (×2): 1 via ORAL
  Filled 2022-03-08 (×2): qty 1

## 2022-03-08 MED ORDER — INSULIN ASPART 100 UNIT/ML IJ SOLN
0.0000 [IU] | Freq: Three times a day (TID) | INTRAMUSCULAR | Status: DC
  Administered 2022-03-08: 2 [IU] via SUBCUTANEOUS

## 2022-03-08 MED ORDER — ACETAMINOPHEN 500 MG PO TABS: 1000.0000 mg | ORAL_TABLET | Freq: Once | ORAL | Status: DC

## 2022-03-08 MED ORDER — LORATADINE 10 MG PO TABS
10.0000 mg | ORAL_TABLET | Freq: Every day | ORAL | Status: DC
  Administered 2022-03-09: 10 mg via ORAL
  Filled 2022-03-08 (×2): qty 1

## 2022-03-08 MED ORDER — OXYCODONE HCL 5 MG PO TABS
10.0000 mg | ORAL_TABLET | ORAL | Status: DC | PRN
  Filled 2022-03-08: qty 2

## 2022-03-08 MED ORDER — PHENYLEPHRINE 80 MCG/ML (10ML) SYRINGE FOR IV PUSH (FOR BLOOD PRESSURE SUPPORT)
PREFILLED_SYRINGE | INTRAVENOUS | Status: DC | PRN
  Administered 2022-03-08 (×3): 80 ug via INTRAVENOUS

## 2022-03-08 MED ORDER — BUPIVACAINE HCL (PF) 0.25 % IJ SOLN
INTRAMUSCULAR | Status: AC
  Filled 2022-03-08: qty 30

## 2022-03-08 MED ORDER — METHOCARBAMOL 500 MG PO TABS
500.0000 mg | ORAL_TABLET | Freq: Four times a day (QID) | ORAL | Status: DC | PRN
  Administered 2022-03-08 – 2022-03-09 (×2): 500 mg via ORAL
  Filled 2022-03-08 (×2): qty 1

## 2022-03-08 MED ORDER — SODIUM CHLORIDE (PF) 0.9 % IJ SOLN
INTRAMUSCULAR | Status: DC | PRN
  Administered 2022-03-08: 30 mL

## 2022-03-08 MED ORDER — GLYCOPYRROLATE 0.2 MG/ML IJ SOLN
INTRAMUSCULAR | Status: DC | PRN
  Administered 2022-03-08 (×2): .1 mg via INTRAVENOUS

## 2022-03-08 MED ORDER — ONDANSETRON HCL 4 MG/2ML IJ SOLN
INTRAMUSCULAR | Status: AC
  Filled 2022-03-08: qty 2

## 2022-03-08 MED ORDER — LACTATED RINGERS IV SOLN: INTRAVENOUS | Status: DC

## 2022-03-08 MED ORDER — CHLORHEXIDINE GLUCONATE 0.12 % MT SOLN
15.0000 mL | Freq: Once | OROMUCOSAL | Status: AC
  Administered 2022-03-08: 15 mL via OROMUCOSAL

## 2022-03-08 MED ORDER — FENTANYL CITRATE (PF) 100 MCG/2ML IJ SOLN
INTRAMUSCULAR | Status: DC | PRN
  Administered 2022-03-08: 25 ug via INTRAVENOUS
  Administered 2022-03-08: 50 ug via INTRAVENOUS

## 2022-03-08 MED ORDER — PROPOFOL 500 MG/50ML IV EMUL
INTRAVENOUS | Status: DC | PRN
  Administered 2022-03-08: 100 ug/kg/min via INTRAVENOUS

## 2022-03-08 MED ORDER — DEXAMETHASONE SODIUM PHOSPHATE 10 MG/ML IJ SOLN
INTRAMUSCULAR | Status: AC
  Filled 2022-03-08: qty 1

## 2022-03-08 MED ORDER — HYDROMORPHONE HCL 1 MG/ML IJ SOLN: 0.5000 mg | INTRAMUSCULAR | Status: DC | PRN

## 2022-03-08 MED ORDER — SODIUM CHLORIDE 0.9 % IV SOLN: INTRAVENOUS | Status: DC

## 2022-03-08 MED ORDER — PHENOL 1.4 % MT LIQD: 1.0000 | OROMUCOSAL | Status: DC | PRN

## 2022-03-08 MED ORDER — ACETAMINOPHEN 325 MG PO TABS
325.0000 mg | ORAL_TABLET | Freq: Four times a day (QID) | ORAL | Status: DC | PRN
  Administered 2022-03-09: 650 mg via ORAL
  Filled 2022-03-08: qty 2

## 2022-03-08 MED ORDER — DIPHENHYDRAMINE HCL 12.5 MG/5ML PO ELIX
12.5000 mg | ORAL_SOLUTION | ORAL | Status: DC | PRN
  Administered 2022-03-08: 25 mg via ORAL
  Filled 2022-03-08: qty 10

## 2022-03-08 MED ORDER — CEFAZOLIN SODIUM-DEXTROSE 2-4 GM/100ML-% IV SOLN
2.0000 g | INTRAVENOUS | Status: AC
  Administered 2022-03-08: 2 g via INTRAVENOUS
  Filled 2022-03-08: qty 100

## 2022-03-08 MED ORDER — HYDROMORPHONE HCL 1 MG/ML IJ SOLN: 0.2500 mg | INTRAMUSCULAR | Status: DC | PRN

## 2022-03-08 MED ORDER — DOCUSATE SODIUM 100 MG PO CAPS
100.0000 mg | ORAL_CAPSULE | Freq: Two times a day (BID) | ORAL | Status: DC
  Administered 2022-03-08 – 2022-03-09 (×3): 100 mg via ORAL
  Filled 2022-03-08 (×3): qty 1

## 2022-03-08 MED ORDER — POLYETHYLENE GLYCOL 3350 17 G PO PACK: 17.0000 g | PACK | Freq: Every day | ORAL | Status: DC | PRN

## 2022-03-08 MED ORDER — METHOCARBAMOL 1000 MG/10ML IJ SOLN: 500.0000 mg | Freq: Four times a day (QID) | INTRAVENOUS | Status: DC | PRN

## 2022-03-08 MED ORDER — ALUM & MAG HYDROXIDE-SIMETH 200-200-20 MG/5ML PO SUSP: 30.0000 mL | ORAL | Status: DC | PRN

## 2022-03-08 MED ORDER — AMISULPRIDE (ANTIEMETIC) 5 MG/2ML IV SOLN: 10.0000 mg | Freq: Once | INTRAVENOUS | Status: DC | PRN

## 2022-03-08 MED ORDER — PROPOFOL 10 MG/ML IV BOLUS
INTRAVENOUS | Status: DC | PRN
  Administered 2022-03-08: 20 mg via INTRAVENOUS
  Administered 2022-03-08: 10 mg via INTRAVENOUS

## 2022-03-08 MED ORDER — EPHEDRINE 5 MG/ML INJ
INTRAVENOUS | Status: AC
  Filled 2022-03-08: qty 10

## 2022-03-08 MED ORDER — EPINEPHRINE PF 1 MG/ML IJ SOLN
INTRAMUSCULAR | Status: AC
  Filled 2022-03-08: qty 1

## 2022-03-08 MED ORDER — ONDANSETRON HCL 4 MG/2ML IJ SOLN: 4.0000 mg | Freq: Four times a day (QID) | INTRAMUSCULAR | Status: DC | PRN

## 2022-03-08 MED ORDER — EPHEDRINE 5 MG/ML INJ
INTRAVENOUS | Status: AC
  Filled 2022-03-08: qty 5

## 2022-03-08 MED ORDER — SODIUM CHLORIDE 0.9 % IR SOLN
Status: DC | PRN
  Administered 2022-03-08 (×2): 3000 mL

## 2022-03-08 MED ORDER — ASPIRIN 81 MG PO CHEW
81.0000 mg | CHEWABLE_TABLET | Freq: Two times a day (BID) | ORAL | Status: DC
  Administered 2022-03-08 – 2022-03-09 (×2): 81 mg via ORAL
  Filled 2022-03-08 (×2): qty 1

## 2022-03-08 MED ORDER — ONDANSETRON HCL 4 MG/2ML IJ SOLN
INTRAMUSCULAR | Status: DC | PRN
  Administered 2022-03-08: 4 mg via INTRAVENOUS

## 2022-03-08 MED ORDER — MIDAZOLAM HCL 2 MG/2ML IJ SOLN
INTRAMUSCULAR | Status: AC
  Filled 2022-03-08: qty 2

## 2022-03-08 MED ORDER — FENTANYL CITRATE (PF) 100 MCG/2ML IJ SOLN
INTRAMUSCULAR | Status: AC
  Filled 2022-03-08: qty 2

## 2022-03-08 MED ORDER — POVIDONE-IODINE 10 % EX SWAB
2.0000 | Freq: Once | CUTANEOUS | Status: AC
  Administered 2022-03-08: 2 via TOPICAL

## 2022-03-08 MED ORDER — ISOPROPYL ALCOHOL 70 % SOLN
Status: DC | PRN
  Administered 2022-03-08: 1 via TOPICAL

## 2022-03-08 MED ORDER — PHENYLEPHRINE 80 MCG/ML (10ML) SYRINGE FOR IV PUSH (FOR BLOOD PRESSURE SUPPORT)
PREFILLED_SYRINGE | INTRAVENOUS | Status: AC
  Filled 2022-03-08: qty 10

## 2022-03-08 MED ORDER — TRANEXAMIC ACID-NACL 1000-0.7 MG/100ML-% IV SOLN
1000.0000 mg | INTRAVENOUS | Status: AC
  Administered 2022-03-08: 1000 mg via INTRAVENOUS
  Filled 2022-03-08: qty 100

## 2022-03-08 MED ORDER — DEXAMETHASONE SODIUM PHOSPHATE 4 MG/ML IJ SOLN
INTRAMUSCULAR | Status: DC | PRN
  Administered 2022-03-08: 5 mg via PERINEURAL

## 2022-03-08 MED ORDER — EPHEDRINE SULFATE-NACL 50-0.9 MG/10ML-% IV SOSY
PREFILLED_SYRINGE | INTRAVENOUS | Status: DC | PRN
  Administered 2022-03-08 (×2): 5 mg via INTRAVENOUS
  Administered 2022-03-08 (×3): 10 mg via INTRAVENOUS
  Administered 2022-03-08 (×2): 5 mg via INTRAVENOUS

## 2022-03-08 MED ORDER — DEXAMETHASONE SODIUM PHOSPHATE 10 MG/ML IJ SOLN
INTRAMUSCULAR | Status: DC | PRN
  Administered 2022-03-08: 10 mg via INTRAVENOUS

## 2022-03-08 MED ORDER — ORAL CARE MOUTH RINSE: 15.0000 mL | Freq: Once | OROMUCOSAL | Status: AC

## 2022-03-08 MED ORDER — SODIUM CHLORIDE (PF) 0.9 % IJ SOLN
INTRAMUSCULAR | Status: AC
  Filled 2022-03-08: qty 30

## 2022-03-08 MED ORDER — ACETAMINOPHEN 500 MG PO TABS
1000.0000 mg | ORAL_TABLET | Freq: Once | ORAL | Status: DC
  Filled 2022-03-08: qty 2

## 2022-03-08 MED ORDER — MENTHOL 3 MG MT LOZG: 1.0000 | LOZENGE | OROMUCOSAL | Status: DC | PRN

## 2022-03-08 MED ORDER — ORAL CARE MOUTH RINSE: 15.0000 mL | OROMUCOSAL | Status: DC | PRN

## 2022-03-08 SURGICAL SUPPLY — 64 items
BAG COUNTER SPONGE SURGICOUNT (BAG) IMPLANT
BAG ZIPLOCK 12X15 (MISCELLANEOUS) IMPLANT
BATTERY INSTRU NAVIGATION (MISCELLANEOUS) ×3 IMPLANT
BLADE SAW RECIPROCATING 77.5 (BLADE) ×1 IMPLANT
BNDG ELASTIC 4X5.8 VLCR STR LF (GAUZE/BANDAGES/DRESSINGS) ×1 IMPLANT
BNDG ELASTIC 6X5.8 VLCR STR LF (GAUZE/BANDAGES/DRESSINGS) ×1 IMPLANT
CHLORAPREP W/TINT 26 (MISCELLANEOUS) ×2 IMPLANT
COMP FEM PS KNEE STD 10 LT (Knees) ×1 IMPLANT
COMP TIB KNEE H 0D LT (Joint) ×1 IMPLANT
COMPONENT FEM PS KN STD 10 LT (Knees) IMPLANT
COMPONENT TIB KNEE H 0D LT (Joint) IMPLANT
COVER SURGICAL LIGHT HANDLE (MISCELLANEOUS) ×1 IMPLANT
DERMABOND ADVANCED .7 DNX12 (GAUZE/BANDAGES/DRESSINGS) ×2 IMPLANT
DRAPE SHEET LG 3/4 BI-LAMINATE (DRAPES) ×3 IMPLANT
DRAPE U-SHAPE 47X51 STRL (DRAPES) ×1 IMPLANT
DRSG AQUACEL AG ADV 3.5X10 (GAUZE/BANDAGES/DRESSINGS) ×1 IMPLANT
ELECT BLADE TIP CTD 4 INCH (ELECTRODE) ×1 IMPLANT
ELECT REM PT RETURN 15FT ADLT (MISCELLANEOUS) ×1 IMPLANT
GAUZE SPONGE 4X4 12PLY STRL (GAUZE/BANDAGES/DRESSINGS) ×1 IMPLANT
GLOVE BIO SURGEON STRL SZ7 (GLOVE) ×1 IMPLANT
GLOVE BIO SURGEON STRL SZ8.5 (GLOVE) ×2 IMPLANT
GLOVE BIOGEL PI IND STRL 7.5 (GLOVE) ×1 IMPLANT
GLOVE BIOGEL PI IND STRL 8.5 (GLOVE) ×1 IMPLANT
GOWN SPEC L3 XXLG W/TWL (GOWN DISPOSABLE) ×1 IMPLANT
GOWN STRL REUS W/ TWL XL LVL3 (GOWN DISPOSABLE) ×1 IMPLANT
GOWN STRL REUS W/TWL XL LVL3 (GOWN DISPOSABLE) ×1
HANDPIECE INTERPULSE COAX TIP (DISPOSABLE) ×1
HDLS TROCR DRIL PIN KNEE 75 (PIN) ×2
HOLDER FOLEY CATH W/STRAP (MISCELLANEOUS) ×1 IMPLANT
HOOD PEEL AWAY T7 (MISCELLANEOUS) ×3 IMPLANT
KIT TURNOVER KIT A (KITS) IMPLANT
LINER TIB ASF PS GH/7-12 10 LT (Liner) IMPLANT
MARKER SKIN DUAL TIP RULER LAB (MISCELLANEOUS) ×1 IMPLANT
NDL SAFETY ECLIP 18X1.5 (MISCELLANEOUS) ×1 IMPLANT
NDL SPNL 18GX3.5 QUINCKE PK (NEEDLE) ×1 IMPLANT
NEEDLE SPNL 18GX3.5 QUINCKE PK (NEEDLE) ×1 IMPLANT
NS IRRIG 1000ML POUR BTL (IV SOLUTION) ×1 IMPLANT
PACK TOTAL KNEE CUSTOM (KITS) ×1 IMPLANT
PADDING CAST COTTON 6X4 STRL (CAST SUPPLIES) ×1 IMPLANT
PATELLA STD SZ 38X10 (Miscellaneous) IMPLANT
PIN DRILL HDLS TROCAR 75 4PK (PIN) IMPLANT
PROTECTOR NERVE ULNAR (MISCELLANEOUS) ×1 IMPLANT
SAW OSC TIP CART 19.5X105X1.3 (SAW) ×1 IMPLANT
SCREW FEMALE HEX FIX 25X2.5 (ORTHOPEDIC DISPOSABLE SUPPLIES) IMPLANT
SEALER BIPOLAR AQUA 6.0 (INSTRUMENTS) ×1 IMPLANT
SET HNDPC FAN SPRY TIP SCT (DISPOSABLE) ×1 IMPLANT
SET PAD KNEE POSITIONER (MISCELLANEOUS) ×1 IMPLANT
SOLUTION PRONTOSAN WOUND 350ML (IRRIGATION / IRRIGATOR) IMPLANT
SPIKE FLUID TRANSFER (MISCELLANEOUS) ×2 IMPLANT
SUT MNCRL AB 3-0 PS2 18 (SUTURE) ×1 IMPLANT
SUT MNCRL AB 4-0 PS2 18 (SUTURE) IMPLANT
SUT MON AB 2-0 CT1 36 (SUTURE) ×1 IMPLANT
SUT STRATAFIX PDO 1 14 VIOLET (SUTURE) ×1
SUT STRATFX PDO 1 14 VIOLET (SUTURE) ×1
SUT VIC AB 1 CTX 36 (SUTURE) ×2
SUT VIC AB 1 CTX36XBRD ANBCTR (SUTURE) ×2 IMPLANT
SUT VIC AB 2-0 CT1 27 (SUTURE) ×1
SUT VIC AB 2-0 CT1 TAPERPNT 27 (SUTURE) ×1 IMPLANT
SUTURE STRATFX PDO 1 14 VIOLET (SUTURE) ×1 IMPLANT
SYR 3ML LL SCALE MARK (SYRINGE) ×1 IMPLANT
TRAY FOLEY MTR SLVR 16FR STAT (SET/KITS/TRAYS/PACK) IMPLANT
TUBE SUCTION HIGH CAP CLEAR NV (SUCTIONS) ×1 IMPLANT
WATER STERILE IRR 1000ML POUR (IV SOLUTION) ×2 IMPLANT
WRAP KNEE MAXI GEL POST OP (GAUZE/BANDAGES/DRESSINGS) IMPLANT

## 2022-03-08 NOTE — Transfer of Care (Signed)
Immediate Anesthesia Transfer of Care Note  Patient: Dale Le  Procedure(s) Performed: COMPUTER ASSISTED TOTAL KNEE ARTHROPLASTY (Left: Knee)  Patient Location: PACU  Anesthesia Type:Spinal  Level of Consciousness: drowsy  Airway & Oxygen Therapy: Patient Spontanous Breathing and Patient connected to face mask oxygen  Post-op Assessment: Report given to RN and Post -op Vital signs reviewed and stable  Post vital signs: Reviewed and stable  Last Vitals:  Vitals Value Taken Time  BP 127/72 03/08/22 1027  Temp    Pulse 69 03/08/22 1029  Resp 20 03/08/22 1029  SpO2 100 % 03/08/22 1029  Vitals shown include unvalidated device data.  Last Pain:  Vitals:   03/08/22 0621  TempSrc:   PainSc: 6          Complications: No notable events documented.

## 2022-03-08 NOTE — Anesthesia Procedure Notes (Signed)
Procedure Name: MAC Date/Time: 03/08/2022 7:30 AM  Performed by: Deliah Boston, CRNAPre-anesthesia Checklist: Patient identified, Emergency Drugs available, Suction available and Patient being monitored Patient Re-evaluated:Patient Re-evaluated prior to induction Oxygen Delivery Method: Simple face mask Preoxygenation: Pre-oxygenation with 100% oxygen Induction Type: IV induction Placement Confirmation: positive ETCO2 and breath sounds checked- equal and bilateral Dental Injury: Teeth and Oropharynx as per pre-operative assessment

## 2022-03-08 NOTE — Op Note (Signed)
OPERATIVE REPORT  SURGEON: Rod Can, MD   ASSISTANT: Larene Pickett, PA-C  PREOPERATIVE DIAGNOSIS: Primary Left knee arthritis.   POSTOPERATIVE DIAGNOSIS: Primary Left knee arthritis.   PROCEDURE: Computer assisted Left total knee arthroplasty.   IMPLANTS: Zimmer Persona PPS Cementless CR femur, size 10. Persona 0 degree Spiked Keel OsseoTi Tibia, size H. Vivacit-E polyethelyene insert, size 10 mm, CR. TM standard patella, size 38 mm.  ANESTHESIA:  MAC, Regional, and Spinal  TOURNIQUET TIME: Not utilized.   ESTIMATED BLOOD LOSS: 200 mL.    ANTIBIOTICS: 2 g Ancef.  DRAINS: None.  COMPLICATIONS: None   CONDITION: PACU - hemodynamically stable.   BRIEF CLINICAL NOTE: Dale Le is a 77 y.o. male with a long-standing history of Left knee arthritis. After failing conservative management, the patient was indicated for total knee arthroplasty. The risks, benefits, and alternatives to the procedure were explained, and the patient elected to proceed.  PROCEDURE IN DETAIL: Adductor canal block was obtained in the pre-op holding area. Once inside the operative room, spinal anesthesia was obtained, and a foley catheter was inserted. The patient was then positioned and the lower extremity was prepped and draped in the normal sterile surgical fashion.  A time-out was called verifying side and site of surgery. The patient received IV antibiotics within 60 minutes of beginning the procedure. A tourniquet was not utilized.   An anterior approach to the knee was performed utilizing a midvastus arthrotomy. A medial release was performed and the patellar fat pad was excised. Stryker imageless navigation was used to cut the distal femur perpendicular to the mechanical axis. A freehand patellar resection was performed, and the patella was sized an prepared with 3 lug holes.  Nagivation was used to make a neutral proximal tibia resection, taking 10 mm of bone from the less affected lateral side  with 3 degrees of slope. The menisci were excised. A spacer block was placed, and the alignment and balance in extension were confirmed.   The distal femur was sized using the 3-degree external rotation guide referencing the posterior femoral cortex. The appropriate 4-in-1 cutting block was pinned into place. Rotation was checked using Whiteside's line, the epicondylar axis, and then confirmed with a spacer block in flexion. The remaining femoral cuts were performed, taking care to protect the MCL.  The tibia was sized and the trial tray was pinned into place. The remaining trail components were inserted. The knee was stable to varus and valgus stress through a full range of motion. The patella tracked centrally, and the PCL was well balanced. The trial components were removed, and the proximal tibial surface was prepared. Final components were impacted into place. The knee was tested for a final time and found to be well balanced.   The wound was copiously irrigated with Prontosan solution and normal saline using pule lavage.  Marcaine solution was injected into the periarticular soft tissue.  The wound was closed in layers using #1 Vicryl and Stratafix for the fascia, 2-0 Vicryl for the subcutaneous fat, 2-0 Monocryl for the deep dermal layer, 3-0 running Monocryl subcuticular Stitch, and 4-0 Monocryl stay sutures at both ends of the wound. Dermabond was applied to the skin.  Once the glue was fully dried, an Aquacell Ag and compressive dressing were applied.  The patient was transported to the recovery room in stable condition.  Sponge, needle, and instrument counts were correct at the end of the case x2.  The patient tolerated the procedure well and there were no  known complications.  Please note that a surgical assistant was a medical necessity for this procedure in order to perform it in a safe and expeditious manner. Surgical assistant was necessary to retract the ligaments and vital neurovascular  structures to prevent injury to them and also necessary for proper positioning of the limb to allow for anatomic placement of the prosthesis.

## 2022-03-08 NOTE — Progress Notes (Signed)
Sent a text letting SO know the patient is doing well but holding for a bed.  Will update her when we have a bed assignment available.

## 2022-03-08 NOTE — Interval H&P Note (Signed)
History and Physical Interval Note:  03/08/2022 7:13 AM  Dale Le  has presented today for surgery, with the diagnosis of Left knee osteoarthritis.  The various methods of treatment have been discussed with the patient and family. After consideration of risks, benefits and other options for treatment, the patient has consented to  Procedure(s) with comments: [place] (Left) - 150 as a surgical intervention.  The patient's history has been reviewed, patient examined, no change in status, stable for surgery.  I have reviewed the patient's chart and labs.  Questions were answered to the patient's satisfaction.     Hilton Cork Joycelyn Liska

## 2022-03-08 NOTE — Anesthesia Procedure Notes (Addendum)
Spinal  Patient location during procedure: OR Start time: 03/08/2022 7:30 AM End time: 03/08/2022 7:39 AM Reason for block: surgical anesthesia Staffing Performed: anesthesiologist  Anesthesiologist: Nolon Nations, MD Performed by: Nolon Nations, MD Authorized by: Nolon Nations, MD   Preanesthetic Checklist Completed: patient identified, IV checked, site marked, risks and benefits discussed, surgical consent, monitors and equipment checked, pre-op evaluation and timeout performed Spinal Block Patient position: sitting Prep: DuraPrep and site prepped and draped Patient monitoring: heart rate, continuous pulse ox and blood pressure Approach: right paramedian Location: L3-4 Injection technique: single-shot Needle Needle type: Spinocan  Needle gauge: 25 G Needle length: 9 cm Additional Notes Expiration date of kit checked and confirmed. Patient tolerated procedure well, without complications.

## 2022-03-08 NOTE — Anesthesia Postprocedure Evaluation (Signed)
Anesthesia Post Note  Patient: Dale Le  Procedure(s) Performed: COMPUTER ASSISTED TOTAL KNEE ARTHROPLASTY (Left: Knee)     Patient location during evaluation: PACU Anesthesia Type: Spinal Level of consciousness: awake and alert Pain management: pain level controlled Vital Signs Assessment: post-procedure vital signs reviewed and stable Respiratory status: spontaneous breathing Cardiovascular status: stable Anesthetic complications: no   No notable events documented.  Last Vitals:  Vitals:   03/08/22 1230 03/08/22 1245  BP:    Pulse: 90 100  Resp: 17 18  Temp:    SpO2: 99% 98%    Last Pain:  Vitals:   03/08/22 1200  TempSrc:   PainSc: 0-No pain                 Nolon Nations

## 2022-03-08 NOTE — Discharge Instructions (Signed)
Dr. Rod Can Total Joint Specialist [facility] [address], [place], [place] [address] [phone]  TOTAL KNEE REPLACEMENT POSTOPERATIVE DIRECTIONS    Knee Rehabilitation, Guidelines Following Surgery  Results after knee surgery are often greatly improved when you follow the exercise, range of motion and muscle strengthening exercises prescribed by your doctor. Safety measures are also important to protect the knee from further injury. Any time any of these exercises cause you to have increased pain or swelling in your knee joint, decrease the amount until you are comfortable again and slowly increase them. If you have problems or questions, call your caregiver or physical therapist for advice.   WEIGHT BEARING Weight bearing as tolerated with assist device (walker, cane, etc) as directed, use it as long as suggested by your surgeon or therapist, typically at least 4-6 weeks.  HOME CARE INSTRUCTIONS  Remove items at home which could result in a fall. This includes throw rugs or furniture in walking pathways.  Continue medications as instructed at time of discharge. You may have some home medications which will be placed on hold until you complete the course of blood thinner medication.  You may start showering once you are discharged home but do not submerge the incision under water. Just pat the incision dry and apply a dry gauze dressing on daily. Walk with walker as instructed.  You may resume a sexual relationship in one month or when given the OK by your doctor.  Use walker as long as suggested by your caregivers. Avoid periods of inactivity such as sitting longer than an hour when not asleep. This helps prevent blood clots.  You may put full weight on your legs and walk as much as is comfortable.  You may return to work once you are cleared by your doctor.  Do not drive a car for 6 weeks or until released by you surgeon.  Do not drive while  taking narcotics.  Wear the elastic stockings for three weeks following surgery during the day but you may remove then at night. Make sure you keep all of your appointments after your operation with all of your doctors and caregivers. You should call the office at the above phone number and make an appointment for approximately two weeks after the date of your surgery. Do not remove your surgical dressing. The dressing is waterproof; you may take showers in 3 days, but do not take tub baths or submerge the dressing. Please pick up a stool softener and laxative for home use as long as you are requiring pain medications. ICE to the affected knee every three hours for 30 minutes at a time and then as needed for pain and swelling.  Continue to use ice on the knee for pain and swelling from surgery. You may notice swelling that will progress down to the foot and ankle.  This is normal after surgery.  Elevate the leg when you are not up walking on it.   It is important for you to complete the blood thinner medication as prescribed by your doctor. Continue to use the breathing machine which will help keep your temperature down.  It is common for your temperature to cycle up and down following surgery, especially at night when you are not up moving around and exerting yourself.  The breathing machine keeps your lungs expanded and your temperature down.  RANGE OF MOTION AND STRENGTHENING EXERCISES  Rehabilitation of the knee is important following a knee injury or an  operation. After just a few days of immobilization, the muscles of the thigh which control the knee become weakened and shrink (atrophy). Knee exercises are designed to build up the tone and strength of the thigh muscles and to improve knee motion. Often times heat used for twenty to thirty minutes before working out will loosen up your tissues and help with improving the range of motion but do not use heat for the first two weeks following surgery.  These exercises can be done on a training (exercise) mat, on the floor, on a table or on a bed. Use what ever works the best and is most comfortable for you Knee exercises include:  Leg Lifts - While your knee is still immobilized in a splint or cast, you can do straight leg raises. Lift the leg to 60 degrees, hold for 3 sec, and slowly lower the leg. Repeat 10-20 times 2-3 times daily. Perform this exercise against resistance later as your knee gets better.  Quad and Hamstring Sets - Tighten up the muscle on the front of the thigh (Quad) and hold for 5-10 sec. Repeat this 10-20 times hourly. Hamstring sets are done by pushing the foot backward against an object and holding for 5-10 sec. Repeat as with quad sets.  A rehabilitation program following serious knee injuries can speed recovery and prevent re-injury in the future due to weakened muscles. Contact your doctor or a physical therapist for more information on knee rehabilitation.   POST-OPERATIVE OPIOID TAPER INSTRUCTIONS: It is important to wean off of your opioid medication as soon as possible. If you do not need pain medication after your surgery it is ok to stop day one. Opioids include: Codeine, Hydrocodone(Norco, Vicodin), Oxycodone(Percocet, oxycontin) and hydromorphone amongst others.  Long term and even short term use of opiods can cause: Increased pain response Dependence Constipation Depression Respiratory depression And more.  Withdrawal symptoms can include Flu like symptoms Nausea, vomiting And more Techniques to manage these symptoms Hydrate well Eat regular healthy meals Stay active Use relaxation techniques(deep breathing, meditating, yoga) Do Not substitute Alcohol to help with tapering If you have been on opioids for less than two weeks and do not have pain than it is ok to stop all together.  Plan to wean off of opioids This plan should start within one week post op of your joint replacement. Maintain the same  interval or time between taking each dose and first decrease the dose.  Cut the total daily intake of opioids by one tablet each day Next start to increase the time between doses. The last dose that should be eliminated is the evening dose.    SKILLED REHAB INSTRUCTIONS: If the patient is transferred to a skilled rehab facility following release from the hospital, a list of the current medications will be sent to the facility for the patient to continue.  When discharged from the skilled rehab facility, please have the facility set up the patient's [place] prior to being released. Also, the skilled facility will be responsible for providing the patient with their medications at time of release from the facility to include their pain medication, the muscle relaxants, and their blood thinner medication. If the patient is still at the rehab facility at time of the two week follow up appointment, the skilled rehab facility will also need to assist the patient in arranging follow up appointment in our office and any transportation needs.  MAKE SURE YOU:  Understand these instructions.  Will watch  your condition.  Will get help right away if you are not doing well or get worse.    Pick up stool softner and laxative for home use following surgery while on pain medications. Do NOT remove your dressing. You may shower.  Do not take tub baths or submerge incision under water. May shower starting three days after surgery. Please use a clean towel to pat the incision dry following showers. Continue to use ice for pain and swelling after surgery. Do not use any lotions or creams on the incision until instructed by your surgeon.  

## 2022-03-08 NOTE — Evaluation (Signed)
Physical Therapy Evaluation Patient Details Name: Dale Le MRN: [id number] DOB: January 06, 1946 Today's Date: 03/08/2022  History of Present Illness  39 male admitted for  LTKA, H/O R TKA  Clinical Impression  Pt admitted with above diagnosis.  Pt currently with functional limitations due to the deficits listed below (see PT Problem List). Pt will benefit from skilled PT to increase their independence and safety with mobility to allow discharge to the venue listed below.     The patient ambulated  120' with RW. Plans to DC home with family support.     Recommendations for follow up therapy are one component of a multi-disciplinary discharge planning process, led by the attending physician.  Recommendations may be updated based on patient status, additional functional criteria and insurance authorization.  Follow Up Recommendations Follow physician's recommendations for discharge plan and follow up therapies      Assistance Recommended at Discharge Set up Supervision/Assistance  Patient can return home with the following  A little help with walking and/or transfers;Assistance with cooking/housework;Assist for transportation;Help with stairs or ramp for entrance    Equipment Recommendations Rolling walker (2 wheels) (RW delivered)  Recommendations for Other Services       Functional Status Assessment Patient has had a recent decline in their functional status and demonstrates the ability to make significant improvements in function in a reasonable and predictable amount of time.     Precautions / Restrictions Precautions Precautions: Fall;Knee      Mobility  Bed Mobility Overal bed mobility: Needs Assistance Bed Mobility: Supine to Sit     Supine to sit: Modified independent (Device/Increase time)          Transfers Overall transfer level: Needs assistance Equipment used: Rolling walker (2 wheels) Transfers: Sit to/from Stand Sit to Stand: Supervision            General transfer comment: cues for safety and left leg position    Ambulation/Gait Ambulation/Gait assistance: Min guard Gait Distance (Feet): 120 Feet Assistive device: Rolling walker (2 wheels) Gait Pattern/deviations: Step-to pattern, Step-through pattern       General Gait Details: cues for sequence and safety  Stairs            Wheelchair Mobility    Modified Rankin (Stroke Patients Only)       Balance Overall balance assessment: Mild deficits observed, not formally tested                                           Pertinent Vitals/Pain Pain Assessment Pain Assessment: No/denies pain    Home Living Family/patient expects to be discharged to:: Private residence Living Arrangements: Spouse/significant other Available Help at Discharge: Family;Available 24 hours/day Type of Home: House Home Access: Stairs to enter   [employer] of Steps: 1   Home Layout: Able to live on main level with bedroom/bathroom Home Equipment: Conservation officer, nature (2 wheels)      Prior Function Prior Level of Function : Independent/Modified Independent             Mobility Comments: walks 2.5 miles       Hand Dominance        Extremity/Trunk Assessment   Upper Extremity Assessment Upper Extremity Assessment: Overall WFL for tasks assessed    Lower Extremity Assessment Lower Extremity Assessment: LLE deficits/detail LLE Deficits / Details: +SLR, knee flexion to 80 , mildly niumb around  the  knee    Cervical / Trunk Assessment Cervical / Trunk Assessment: Normal  Communication   Communication: No difficulties  Cognition Arousal/Alertness: Awake/alert Behavior During Therapy: WFL for tasks assessed/performed, Impulsive Overall Cognitive Status: Within Functional Limits for tasks assessed                                          General Comments      Exercises Total Joint Exercises Ankle Circles/Pumps: AROM,  Supine, Both, 10 reps Straight Leg Raises: AROM, Left, 10 reps, Supine   Assessment/Plan    PT Assessment Patient needs continued PT services  PT Problem List Decreased strength;Decreased activity tolerance;Decreased mobility;Decreased safety awareness       PT Treatment Interventions DME instruction;Therapeutic exercise;Gait training;Stair training;Functional mobility training;Therapeutic activities;Patient/family education    PT Goals (Current goals can be found in the Care Plan section)  Acute Rehab PT Goals Patient Stated Goal: go home PT Goal Formulation: With patient/family Time For Goal Achievement: 03/15/22 Potential to Achieve Goals: Good    Frequency 7X/week     Co-evaluation               AM-PAC PT "6 Clicks" Mobility  Outcome Measure Help needed turning from your back to your side while in a flat bed without using bedrails?: None Help needed moving from lying on your back to sitting on the side of a flat bed without using bedrails?: None Help needed moving to and from a bed to a chair (including a wheelchair)?: A Little Help needed standing up from a chair using your arms (e.g., wheelchair or bedside chair)?: A Little Help needed to walk in hospital room?: A Little Help needed climbing 3-5 steps with a railing? : A Little 6 Click Score: 20    End of Session Equipment Utilized During Treatment: Gait belt Activity Tolerance: Patient tolerated treatment well Patient left: in chair;with call bell/phone within reach;with family/visitor present Nurse Communication: Mobility status PT Visit Diagnosis: Unsteadiness on feet (R26.81)    Time: 3833-3832 PT Time Calculation (min) (ACUTE ONLY): 33 min   Charges:   PT Evaluation $PT Eval Low Complexity: 1 Low PT Treatments $Gait Training: 8-22 mins        [place] Office [phone] Weekend [phone]   Claretha Cooper 03/08/2022, 4:36 PM

## 2022-03-08 NOTE — Anesthesia Procedure Notes (Signed)
Anesthesia Regional Block: Adductor canal block   Pre-Anesthetic Checklist: , timeout performed,  Correct Patient, Correct Site, Correct Laterality,  Correct Procedure, Correct Position, site marked,  Risks and benefits discussed,  Surgical consent,  Pre-op evaluation,  At surgeon's request and post-op pain management  Laterality: Lower and Left  Prep: chloraprep       Needles:  Injection technique: Single-shot  Needle Type: Stimiplex     Needle Length: 9cm  Needle Gauge: 21     Additional Needles:   Procedures:,,,, ultrasound used (permanent image in chart),,    Narrative:  Start time: 03/08/2022 6:46 AM End time: 03/08/2022 7:06 AM Injection made incrementally with aspirations every 5 mL.  Performed by: Personally  Anesthesiologist: Nolon Nations, MD  Additional Notes: BP cuff, EKG monitors applied. Sedation begun. Artery and nerve location verified with ultrasound. Anesthetic injected incrementally (62ml), slowly, and after negative aspirations under direct u/s guidance. Good fascial/perineural spread. Tolerated well.

## 2022-03-08 NOTE — Progress Notes (Signed)
Meal tray ordered for patient.

## 2022-03-09 ENCOUNTER — Encounter (HOSPITAL_COMMUNITY): Payer: Self-pay | Admitting: Orthopedic Surgery

## 2022-03-09 DIAGNOSIS — M1712 Unilateral primary osteoarthritis, left knee: Secondary | ICD-10-CM | POA: Diagnosis not present

## 2022-03-09 LAB — BASIC METABOLIC PANEL
Anion gap: 9 (ref 5–15)
BUN: 28 mg/dL — ABNORMAL HIGH (ref 8–23)
CO2: 19 mmol/L — ABNORMAL LOW (ref 22–32)
Calcium: 8.5 mg/dL — ABNORMAL LOW (ref 8.9–10.3)
Chloride: 113 mmol/L — ABNORMAL HIGH (ref 98–111)
Creatinine, Ser: 1.76 mg/dL — ABNORMAL HIGH (ref 0.61–1.24)
GFR, Estimated: 40 mL/min — ABNORMAL LOW (ref >60)
Glucose, Bld: 146 mg/dL — ABNORMAL HIGH (ref 70–99)
Potassium: 4.8 mmol/L (ref 3.5–5.1)
Sodium: 141 mmol/L (ref 135–145)

## 2022-03-09 LAB — CBC
HCT: 28.3 % — ABNORMAL LOW (ref 39.0–52.0)
Hemoglobin: 9.1 g/dL — ABNORMAL LOW (ref 13.0–17.0)
MCH: 30.6 pg (ref 26.0–34.0)
MCHC: 32.2 g/dL (ref 30.0–36.0)
MCV: 95.3 fL (ref 80.0–100.0)
Platelets: 191 10*3/uL (ref 150–400)
RBC: 2.97 MIL/uL — ABNORMAL LOW (ref 4.22–5.81)
RDW: 14.5 % (ref 11.5–15.5)
WBC: 7.2 10*3/uL (ref 4.0–10.5)
nRBC: 0 % (ref 0.0–0.2)

## 2022-03-09 LAB — GLUCOSE, CAPILLARY: Glucose-Capillary: 119 mg/dL — ABNORMAL HIGH (ref 70–99)

## 2022-03-09 NOTE — Progress Notes (Signed)
Physical Therapy Treatment Patient Details Name: Dale Le MRN: [id number] DOB: 08-Aug-1945 Today's Date: 03/09/2022   History of Present Illness 48 male admitted for  LTKA, H/O R TKA    PT Comments    Pt motivated and progressing well with mobility.  Pt up to bathroom and to hall to ambulate increased distance and negotiate stairs.  Pt performed HEP with written instruction provided and reviewed - pt report HHPT to follow but unsure of start date.  Pt eager for dc  home this date.  Recommendations for follow up therapy are one component of a multi-disciplinary discharge planning process, led by the attending physician.  Recommendations may be updated based on patient status, additional functional criteria and insurance authorization.  Follow Up Recommendations  Follow physician's recommendations for discharge plan and follow up therapies     Assistance Recommended at Discharge Set up Supervision/Assistance  Patient can return home with the following A little help with walking and/or transfers;Assistance with cooking/housework;Assist for transportation;Help with stairs or ramp for entrance   Equipment Recommendations  Rolling walker (2 wheels)    Recommendations for Other Services       Precautions / Restrictions Precautions Precautions: Fall;Knee Restrictions Weight Bearing Restrictions: No LLE Weight Bearing: Weight bearing as tolerated     Mobility  Bed Mobility               General bed mobility comments: Pt up in chair and requests back to same    Transfers Overall transfer level: Needs assistance Equipment used: Rolling walker (2 wheels) Transfers: Sit to/from Stand Sit to Stand: Supervision           General transfer comment: cues for LE management and use of UEs to self assist    Ambulation/Gait Ambulation/Gait assistance: Min guard, Supervision Gait Distance (Feet): 150 Feet (and 15' into bathroom) Assistive device: Rolling walker (2  wheels) Gait Pattern/deviations: Step-to pattern, Step-through pattern       General Gait Details: cues for posture, position from RW and safety awareness   Stairs Stairs: Yes Stairs assistance: Min guard Stair Management: No rails, Step to pattern, Forwards, With walker Number of Stairs: 2 General stair comments: single step twice with cues for sequence and foot/RW placement   Wheelchair Mobility    Modified Rankin (Stroke Patients Only)       Balance Overall balance assessment: Mild deficits observed, not formally tested                                          Cognition Arousal/Alertness: Awake/alert Behavior During Therapy: WFL for tasks assessed/performed, Impulsive Overall Cognitive Status: Within Functional Limits for tasks assessed                                          Exercises Total Joint Exercises Ankle Circles/Pumps: AROM, Both, 20 reps, Supine Quad Sets: AROM, Both, 10 reps, Supine Heel Slides: AAROM, Left, 15 reps, Supine Straight Leg Raises: AAROM, AROM, Left, 10 reps, Supine Long Arc Quad: AAROM, AROM, Left, 10 reps, Seated    General Comments        Pertinent Vitals/Pain Pain Assessment Pain Assessment: 0-10 Pain Score: 6  Pain Location: L knee Pain Descriptors / Indicators: Aching, Sore Pain Intervention(s): Monitored during session, Limited activity within patient's tolerance,  Premedicated before session, Ice applied    Home Living                          Prior Function            PT Goals (current goals can now be found in the care plan section) Acute Rehab PT Goals Patient Stated Goal: go home PT Goal Formulation: With patient/family Time For Goal Achievement: 03/15/22 Potential to Achieve Goals: Good Progress towards PT goals: Progressing toward goals    Frequency    7X/week      PT Plan Current plan remains appropriate    Co-evaluation              AM-PAC PT  "6 Clicks" Mobility   Outcome Measure  Help needed turning from your back to your side while in a flat bed without using bedrails?: None Help needed moving from lying on your back to sitting on the side of a flat bed without using bedrails?: None Help needed moving to and from a bed to a chair (including a wheelchair)?: A Little Help needed standing up from a chair using your arms (e.g., wheelchair or bedside chair)?: A Little Help needed to walk in hospital room?: A Little Help needed climbing 3-5 steps with a railing? : A Little 6 Click Score: 20    End of Session Equipment Utilized During Treatment: Gait belt Activity Tolerance: Patient tolerated treatment well Patient left: in chair;with call bell/phone within reach;with family/visitor present Nurse Communication: Mobility status PT Visit Diagnosis: Unsteadiness on feet (R26.81)     Time: 3790-2409 PT Time Calculation (min) (ACUTE ONLY): 37 min  Charges:  $Gait Training: 8-22 mins $Therapeutic Exercise: 8-22 mins                     Dale Le PT Acute Rehabilitation Services Pager [phone] Office [phone]    Dale Le 03/09/2022, 9:02 AM

## 2022-03-09 NOTE — Progress Notes (Signed)
    Subjective:  Patient reports pain as mild to moderate.  Denies N/V/CP/SOB/Abd pain. Denies tingling or numbness in LE bilaterally.   Objective:   VITALS:   Vitals:   03/08/22 1822 03/08/22 2213 03/09/22 0140 03/09/22 0538  BP: 134/69 (!) 106/56 (!) 117/55 127/82  Pulse: 81 73 75 64  Resp: 17 18 18 16   Temp: 98.1 F (36.7 C) 98.7 F (37.1 C) 98.1 F (36.7 C) 97.9 F (36.6 C)  TempSrc:  Oral Oral Oral  SpO2: 97% 97% 97% 97%  Weight:      Height:        NAD Neurologically intact ABD soft Neurovascular intact Sensation intact distally Intact pulses distally Dorsiflexion/Plantar flexion intact Incision: dressing C/D/I No cellulitis present Compartment soft Ace bandage removed and ted hose placed.   Lab Results  Component Value Date   WBC 7.2 03/09/2022   HGB 9.1 (L) 03/09/2022   HCT 28.3 (L) 03/09/2022   MCV 95.3 03/09/2022   PLT 191 03/09/2022   BMET    Component Value Date/Time   NA 141 03/09/2022 0327   K 4.8 03/09/2022 0327   CL 113 (H) 03/09/2022 0327   CO2 19 (L) 03/09/2022 0327   GLUCOSE 146 (H) 03/09/2022 0327   BUN 28 (H) 03/09/2022 0327   CREATININE 1.76 (H) 03/09/2022 0327   CALCIUM 8.5 (L) 03/09/2022 0327   GFRNONAA 40 (L) 03/09/2022 0327     Assessment/Plan: 1 Day Post-Op   Principal Problem:   Osteoarthritis of left knee   WBAT with walker DVT ppx: Aspirin, SCDs, TEDS PO pain control PT/OT: Patient ambulated well with PT yesterday. PT to come by toady.  Dispo: D/c once cleared by PT.   Charlott Rakes, PA-C 03/09/2022, 3:11 PM    [facility]  [place] [address], [address], [place], [place] [address] Phone: [phone] [email or web address] Facebook  [employer]

## 2022-03-09 NOTE — TOC Transition Note (Signed)
Transition of Care [facility]) - CM/SW Discharge Note  Patient Details  Name: Dale Le MRN: [id number] Date of Birth: Jan 21, 1946  [facility] CM/SW Contact:  Sherie Don, LCSW Phone Number: 03/09/2022, 11:53 AM  Clinical Narrative: Patient is expected to discharge home after working with PT. CSW met with patient to review discharge plan and needs. Patient will go home with HHPT and then transition to [place] [facility] [place] [facility]. Patient requested [facility] as he has used this agency before. HH orders are in. Patient received his rolling walker from the [place] and his ice machine will be shipped to his home today. TOC signing off.    Final next level of care: Home w Home Health Services Barriers to Discharge: No Barriers Identified  Patient Goals and CMS Choice CMS Medicare.gov Compare Post Acute Care list provided to:: Patient Choice offered to / list presented to : Patient  Discharge Plan and Services Additional resources added to the After Visit Summary for           DME Arranged: N/A DME Agency: NA HH Arranged: PT [place] Agency: [place] ([place]) Date [place]: 03/09/22 Representative spoke with at [place]: [place] ([place]) Interventions SDOH Screenings   Food Insecurity: No Food Insecurity (03/08/2022)  Housing: Low Risk  (03/08/2022)  Transportation Needs: No Transportation Needs (03/08/2022)  Utilities: Not At Risk (03/08/2022)  Tobacco Use: Medium Risk (03/08/2022)   Readmission Risk Interventions     No data to display

## 2022-03-09 NOTE — Discharge Summary (Signed)
Physician Discharge Summary  Patient ID: Dale Le MRN: [id number] DOB/AGE: [birth year] 77 y.o.  Admit date: 03/08/2022 Discharge date: 03/09/2022  Admission Diagnoses:  Osteoarthritis of left knee  Discharge Diagnoses:  Principal Problem:   Osteoarthritis of left knee   Past Medical History:  Diagnosis Date   Anxiety    PTSD   Arthritis    Depression    Diabetes mellitus without complication ([place])    type 2   Hyperlipemia    Hypertension     Surgeries: Procedure(s): COMPUTER ASSISTED TOTAL KNEE ARTHROPLASTY on 03/08/2022   Consultants (if any):   Discharged Condition: Improved  Hospital Course: Dale Le is an 77 y.o. male who was admitted 03/08/2022 with a diagnosis of Osteoarthritis of left knee and went to the operating room on 03/08/2022 and underwent the above named procedures.    He was given perioperative antibiotics:  Anti-infectives (From admission, onward)    Start     Dose/Rate Route Frequency Ordered Stop   03/08/22 1400  ceFAZolin (ANCEF) IVPB 2g/100 mL premix        2 g 200 mL/hr over 30 Minutes Intravenous Every 6 hours 03/08/22 1328 03/08/22 2113   03/08/22 0600  ceFAZolin (ANCEF) IVPB 2g/100 mL premix        2 g 200 mL/hr over 30 Minutes Intravenous On call to O.R. 03/08/22 0535 03/08/22 0741       He was given sequential compression devices, early ambulation, and ASA for DVT prophylaxis.  He benefited maximally from the hospital stay and there were no complications.    Recent vital signs:  Vitals:   03/09/22 0140 03/09/22 0538  BP: (!) 117/55 127/82  Pulse: 75 64  Resp: 18 16  Temp: 98.1 F (36.7 C) 97.9 F (36.6 C)  SpO2: 97% 97%    Recent laboratory studies:  Lab Results  Component Value Date   HGB 9.1 (L) 03/09/2022   HGB 12.4 (L) 03/01/2022   HGB 12.0 (L) 04/25/2018   Lab Results  Component Value Date   WBC 7.2 03/09/2022   PLT 191 03/09/2022   No results found for: "INR" Lab Results  Component Value Date    NA 141 03/09/2022   K 4.8 03/09/2022   CL 113 (H) 03/09/2022   CO2 19 (L) 03/09/2022   BUN 28 (H) 03/09/2022   CREATININE 1.76 (H) 03/09/2022   GLUCOSE 146 (H) 03/09/2022     Allergies as of 03/09/2022   No Known Allergies      Medication List     TAKE these medications    acetaminophen 325 MG tablet Commonly known as: TYLENOL Take 650 mg by mouth every 6 (six) hours as needed for moderate pain or headache.   cetirizine 10 MG tablet Commonly known as: ZYRTEC Take 10 mg by mouth daily as needed for allergies.   diphenhydrAMINE 25 MG tablet Commonly known as: BENADRYL Take 25 mg by mouth at bedtime as needed for allergies or sleep.   multivitamin with minerals Tabs tablet Take 1 tablet by mouth daily.   polyethylene glycol 17 g packet Commonly known as: MiraLax Take 17 g by mouth daily. What changed:  when to take this reasons to take this   traZODone 50 MG tablet Commonly known as: DESYREL Take 50 mg by mouth at bedtime.          WEIGHT BEARING   Weight bearing as tolerated with assist device (walker, cane, etc) as directed, use it as long as suggested by  your surgeon or therapist, typically at least 4-6 weeks.   EXERCISES  Results after joint replacement surgery are often greatly improved when you follow the exercise, range of motion and muscle strengthening exercises prescribed by your doctor. Safety measures are also important to protect the joint from further injury. Any time any of these exercises cause you to have increased pain or swelling, decrease what you are doing until you are comfortable again and then slowly increase them. If you have problems or questions, call your caregiver or physical therapist for advice.   Rehabilitation is important following a joint replacement. After just a few days of immobilization, the muscles of the leg can become weakened and shrink (atrophy).  These exercises are designed to build up the tone and strength of  the thigh and leg muscles and to improve motion. Often times heat used for twenty to thirty minutes before working out will loosen up your tissues and help with improving the range of motion but do not use heat for the first two weeks following surgery (sometimes heat can increase post-operative swelling).   These exercises can be done on a training (exercise) mat, on the floor, on a table or on a bed. Use whatever works the best and is most comfortable for you.    Use music or television while you are exercising so that the exercises are a pleasant break in your day. This will make your life better with the exercises acting as a break in your routine that you can look forward to.   Perform all exercises about fifteen times, three times per day or as directed.  You should exercise both the operative leg and the other leg as well.  Exercises include:   Quad Sets - Tighten up the muscle on the front of the thigh (Quad) and hold for 5-10 seconds.   Straight Leg Raises - With your knee straight (if you were given a brace, keep it on), lift the leg to 60 degrees, hold for 3 seconds, and slowly lower the leg.  Perform this exercise against resistance later as your leg gets stronger.  Leg Slides: Lying on your back, slowly slide your foot toward your buttocks, bending your knee up off the floor (only go as far as is comfortable). Then slowly slide your foot back down until your leg is flat on the floor again.  Angel Wings: Lying on your back spread your legs to the side as far apart as you can without causing discomfort.  Hamstring Strength:  Lying on your back, push your heel against the floor with your leg straight by tightening up the muscles of your buttocks.  Repeat, but this time bend your knee to a comfortable angle, and push your heel against the floor.  You may put a pillow under the heel to make it more comfortable if necessary.   A rehabilitation program following joint replacement surgery can speed  recovery and prevent re-injury in the future due to weakened muscles. Contact your doctor or a physical therapist for more information on knee rehabilitation.    CONSTIPATION  Constipation is defined medically as fewer than three stools per week and severe constipation as less than one stool per week.  Even if you have a regular bowel pattern at home, your normal regimen is likely to be disrupted due to multiple reasons following surgery.  Combination of anesthesia, postoperative narcotics, change in appetite and fluid intake all can affect your bowels.   YOU MUST use at least  one of the following options; they are listed in order of increasing strength to get the job done.  They are all available over the counter, and you may need to use some, POSSIBLY even all of these options:    Drink plenty of fluids (prune juice may be helpful) and high fiber foods Colace 100 mg by mouth twice a day  Senokot for constipation as directed and as needed Dulcolax (bisacodyl), take with full glass of water  Miralax (polyethylene glycol) once or twice a day as needed.  If you have tried all these things and are unable to have a bowel movement in the first 3-4 days after surgery call either your surgeon or your primary doctor.    If you experience loose stools or diarrhea, hold the medications until you stool forms back up.  If your symptoms do not get better within 1 week or if they get worse, check with your doctor.  If you experience "the worst abdominal pain ever" or develop nausea or vomiting, please contact the office immediately for further recommendations for treatment.   ITCHING:  If you experience itching with your medications, try taking only a single pain pill, or even half a pain pill at a time.  You can also use Benadryl over the counter for itching or also to help with sleep.   TED HOSE STOCKINGS:  Use stockings on both legs until for at least 2 weeks or as directed by physician office. They may be  removed at night for sleeping.  MEDICATIONS:  See your medication summary on the "After Visit Summary" that nursing will review with you.  You may have some home medications which will be placed on hold until you complete the course of blood thinner medication.  It is important for you to complete the blood thinner medication as prescribed.  PRECAUTIONS:  If you experience chest pain or shortness of breath - call 911 immediately for transfer to the hospital emergency department.   If you develop a fever greater that 101 F, purulent drainage from wound, increased redness or drainage from wound, foul odor from the wound/dressing, or calf pain - CONTACT YOUR SURGEON.                                                   FOLLOW-UP APPOINTMENTS:  If you do not already have a post-op appointment, please call the office for an appointment to be seen by your surgeon.  Guidelines for how soon to be seen are listed in your "After Visit Summary", but are typically between 1-4 weeks after surgery.  OTHER INSTRUCTIONS:   Knee Replacement:  Do not place pillow under knee, focus on keeping the knee straight while resting. CPM instructions: 0-90 degrees, 2 hours in the morning, 2 hours in the afternoon, and 2 hours in the evening. Place foam block, curve side up under heel at all times except when in CPM or when walking.  DO NOT modify, tear, cut, or change the foam block in any way.   MAKE SURE YOU:  Understand these instructions.  Get help right away if you are not doing well or get worse.    Thank you for letting us be a part of your medical care team.  It is a privilege we respect greatly.  We hope these instructions will help you stay on  track for a fast and full recovery!   Diagnostic Studies: DG Knee Left Port  Result Date: 03/08/2022 CLINICAL DATA:  Total knee replacement EXAM: PORTABLE LEFT KNEE - 2 VIEW COMPARISON:  None Available. FINDINGS: Total knee arthroplasty that is overall well seated. No acute  fracture. The knee is located. Expected soft tissue and joint space gas. IMPRESSION: No unexpected finding after total knee arthroplasty. Electronically Signed   By: Jorje Guild M.D.   On: 03/08/2022 11:03    Disposition: Discharge disposition: 01-Home or Self Care      HHPT set up through [facility]  Discharge Instructions     Call MD / Call 911   Complete by: As directed    If you experience chest pain or shortness of breath, CALL 911 and be transported to the hospital emergency room.  If you develope a fever above 101 F, pus (white drainage) or increased drainage or redness at the wound, or calf pain, call your surgeon's office.   Constipation Prevention   Complete by: As directed    Drink plenty of fluids.  Prune juice may be helpful.  You may use a stool softener, such as Colace (over the counter) 100 mg twice a day.  Use MiraLax (over the counter) for constipation as needed.   Diet - low sodium heart healthy   Complete by: As directed    Do not put a pillow under the knee. Place it under the heel.   Complete by: As directed    Driving restrictions   Complete by: As directed    No driving for 4 weeks   Increase activity slowly as tolerated   Complete by: As directed    Lifting restrictions   Complete by: As directed    No lifting for 6 weeks   Post-operative opioid taper instructions:   Complete by: As directed    POST-OPERATIVE OPIOID TAPER INSTRUCTIONS: It is important to wean off of your opioid medication as soon as possible. If you do not need pain medication after your surgery it is ok to stop day one. Opioids include: Codeine, Hydrocodone(Norco, Vicodin), Oxycodone(Percocet, oxycontin) and hydromorphone amongst others.  Long term and even short term use of opiods can cause: Increased pain response Dependence Constipation Depression Respiratory depression And more.  Withdrawal symptoms can include Flu like symptoms Nausea, vomiting And  more Techniques to manage these symptoms Hydrate well Eat regular healthy meals Stay active Use relaxation techniques(deep breathing, meditating, yoga) Do Not substitute Alcohol to help with tapering If you have been on opioids for less than two weeks and do not have pain than it is ok to stop all together.  Plan to wean off of opioids This plan should start within one week post op of your joint replacement. Maintain the same interval or time between taking each dose and first decrease the dose.  Cut the total daily intake of opioids by one tablet each day Next start to increase the time between doses. The last dose that should be eliminated is the evening dose.      TED hose   Complete by: As directed    Use stockings (TED hose) for 2 weeks on both leg(s).  You may remove them at night for sleeping.        Follow-up Information     Guerry Covington, Aaron Edelman, MD. Schedule an appointment as soon as possible for a visit in 2 week(s).   Specialty: Orthopedic Surgery Why: For wound re-check Contact information: [address] [place] [address] [phone]                NOTE: Postop Rx sent to [facility] prior to surgery.  Signed: Iline Oven Boneta Standre 03/09/2022, 9:23 AM

## 2022-03-09 NOTE — Progress Notes (Signed)
Patient discharged to home w/ family. Given all belongings, instructions, equipment. Verbalized understanding of instructions. Escorted to pov via w/c.

## 2024-01-14 ENCOUNTER — Other Ambulatory Visit (HOSPITAL_COMMUNITY): Payer: Self-pay | Admitting: Orthopedic Surgery

## 2024-01-14 DIAGNOSIS — Z96652 Presence of left artificial knee joint: Secondary | ICD-10-CM

## 2024-01-21 ENCOUNTER — Ambulatory Visit (HOSPITAL_COMMUNITY)
Admission: RE | Admit: 2024-01-21 | Discharge: 2024-01-21 | Disposition: A | Source: Ambulatory Visit | Attending: Orthopedic Surgery | Admitting: Orthopedic Surgery

## 2024-01-21 DIAGNOSIS — Z96652 Presence of left artificial knee joint: Secondary | ICD-10-CM | POA: Insufficient documentation

## 2024-01-21 MED ORDER — TECHNETIUM TC 99M MEDRONATE IV KIT
20.0000 | PACK | Freq: Once | INTRAVENOUS | Status: AC | PRN
  Administered 2024-01-21: 22 via INTRAVENOUS
